# Patient Record
Sex: Female | Born: 1985
Health system: Southern US, Community
[De-identification: ages and names within clinical notes are randomized; demographics above are authoritative.]

## PROBLEM LIST (undated history)

## (undated) DIAGNOSIS — R51 Headache: Secondary | ICD-10-CM

## (undated) DIAGNOSIS — G43909 Migraine, unspecified, not intractable, without status migrainosus: Secondary | ICD-10-CM

## (undated) DIAGNOSIS — S060X9A Concussion with loss of consciousness of unspecified duration, initial encounter: Secondary | ICD-10-CM

## (undated) DIAGNOSIS — E039 Hypothyroidism, unspecified: Secondary | ICD-10-CM

## (undated) DIAGNOSIS — J45909 Unspecified asthma, uncomplicated: Secondary | ICD-10-CM

## (undated) DIAGNOSIS — R42 Dizziness and giddiness: Secondary | ICD-10-CM

## (undated) DIAGNOSIS — K219 Gastro-esophageal reflux disease without esophagitis: Secondary | ICD-10-CM

## (undated) DIAGNOSIS — E079 Disorder of thyroid, unspecified: Secondary | ICD-10-CM

## (undated) DIAGNOSIS — R112 Nausea with vomiting, unspecified: Secondary | ICD-10-CM

## (undated) DIAGNOSIS — Z8719 Personal history of other diseases of the digestive system: Secondary | ICD-10-CM

## (undated) DIAGNOSIS — Z9889 Other specified postprocedural states: Secondary | ICD-10-CM

## (undated) HISTORY — PX: CHOLECYSTECTOMY: SHX55

## (undated) HISTORY — PX: BREAST BIOPSY: SHX20

---

## 2004-04-08 ENCOUNTER — Observation Stay (HOSPITAL_COMMUNITY): Admission: RE | Admit: 2004-04-08 | Discharge: 2004-04-09 | Payer: Self-pay | Admitting: General Surgery

## 2012-08-13 ENCOUNTER — Telehealth: Payer: Self-pay | Admitting: *Deleted

## 2012-08-13 NOTE — Telephone Encounter (Signed)
Left message for pt to return my call so I can schedule genetic appt. 

## 2012-08-15 ENCOUNTER — Ambulatory Visit: Payer: Self-pay | Admitting: Genetic Counselor

## 2012-08-15 ENCOUNTER — Ambulatory Visit: Payer: Self-pay | Admitting: Lab

## 2012-08-15 DIAGNOSIS — Z8481 Family history of carrier of genetic disease: Secondary | ICD-10-CM

## 2012-08-19 NOTE — Progress Notes (Signed)
Patient was seen for genetic counseling by Annia Friendly, MS, CGC.  Single site BRCA testing was ordered.

## 2012-09-23 ENCOUNTER — Emergency Department (HOSPITAL_BASED_OUTPATIENT_CLINIC_OR_DEPARTMENT_OTHER): Payer: BC Managed Care – PPO

## 2012-09-23 ENCOUNTER — Encounter (HOSPITAL_BASED_OUTPATIENT_CLINIC_OR_DEPARTMENT_OTHER): Payer: Self-pay | Admitting: *Deleted

## 2012-09-23 ENCOUNTER — Emergency Department (HOSPITAL_BASED_OUTPATIENT_CLINIC_OR_DEPARTMENT_OTHER)
Admission: EM | Admit: 2012-09-23 | Discharge: 2012-09-24 | Disposition: A | Discharge status: Home / Self Care | Payer: BC Managed Care – PPO | Attending: Emergency Medicine | Admitting: Emergency Medicine

## 2012-09-23 DIAGNOSIS — R062 Wheezing: Secondary | ICD-10-CM | POA: Insufficient documentation

## 2012-09-23 DIAGNOSIS — Z888 Allergy status to other drugs, medicaments and biological substances status: Secondary | ICD-10-CM | POA: Insufficient documentation

## 2012-09-23 DIAGNOSIS — E079 Disorder of thyroid, unspecified: Secondary | ICD-10-CM | POA: Insufficient documentation

## 2012-09-23 DIAGNOSIS — Z79899 Other long term (current) drug therapy: Secondary | ICD-10-CM | POA: Insufficient documentation

## 2012-09-23 DIAGNOSIS — J45901 Unspecified asthma with (acute) exacerbation: Secondary | ICD-10-CM | POA: Insufficient documentation

## 2012-09-23 DIAGNOSIS — T7840XA Allergy, unspecified, initial encounter: Secondary | ICD-10-CM

## 2012-09-23 DIAGNOSIS — Z8679 Personal history of other diseases of the circulatory system: Secondary | ICD-10-CM | POA: Insufficient documentation

## 2012-09-23 DIAGNOSIS — T4995XA Adverse effect of unspecified topical agent, initial encounter: Secondary | ICD-10-CM | POA: Insufficient documentation

## 2012-09-23 HISTORY — DX: Disorder of thyroid, unspecified: E07.9

## 2012-09-23 HISTORY — DX: Unspecified asthma, uncomplicated: J45.909

## 2012-09-23 HISTORY — DX: Migraine, unspecified, not intractable, without status migrainosus: G43.909

## 2012-09-23 LAB — PREGNANCY, URINE: Preg Test, Ur: NEGATIVE

## 2012-09-23 MED ORDER — ALBUTEROL SULFATE (5 MG/ML) 0.5% IN NEBU
5.0000 mg | INHALATION_SOLUTION | Freq: Once | RESPIRATORY_TRACT | Status: AC
  Administered 2012-09-23: 5 mg via RESPIRATORY_TRACT

## 2012-09-23 MED ORDER — IPRATROPIUM BROMIDE 0.02 % IN SOLN
RESPIRATORY_TRACT | Status: AC
  Filled 2012-09-23: qty 2.5

## 2012-09-23 MED ORDER — IPRATROPIUM BROMIDE 0.02 % IN SOLN
0.5000 mg | Freq: Once | RESPIRATORY_TRACT | Status: AC
  Administered 2012-09-23: 0.5 mg via RESPIRATORY_TRACT

## 2012-09-23 MED ORDER — ALBUTEROL SULFATE (5 MG/ML) 0.5% IN NEBU
INHALATION_SOLUTION | RESPIRATORY_TRACT | Status: AC
  Filled 2012-09-23: qty 1

## 2012-09-23 NOTE — ED Notes (Signed)
MD at bedside. 

## 2012-09-23 NOTE — ED Notes (Signed)
Pt c/o SOB x 1 day hx asthma

## 2012-09-23 NOTE — ED Provider Notes (Addendum)
History  This chart was scribed for Nicole Rabine Smitty Cords, MD by Ardeen Jourdain, ED Scribe. This patient was seen in room MH06/MH06 and the patient's care was started at 2300.  CSN: [id number]  Arrival date & time 09/23/12  2252   First MD Initiated Contact with Patient 09/23/12 2300      Chief Complaint  Patient presents with  . Shortness of Breath     Patient is a 26 y.o. female presenting with wheezing. The history is provided by the patient. No language interpreter was used.  Wheezing  The current episode started today. The onset was gradual. The problem occurs continuously. The problem has been gradually worsening. The problem is moderate. The symptoms are relieved by beta-agonist inhalers (partially). Exacerbated by: taking mucinex. Associated symptoms include wheezing. Pertinent negatives include no chest pain, no chest pressure, no fever, no rhinorrhea, no sore throat, no stridor and no cough. There was no intake of a foreign body. She has not inhaled smoke recently. Her past medical history is significant for asthma and past wheezing. Urine output has been normal.    Nicole Reed is a 26 y.o. female who presents to the Emergency Department complaining of gradually worsening wheezing. She denies fever, neck pain, sore throat, visual disturbance, CP, abdominal pain, nausea, emesis, diarrhea, urinary symptoms, back pain, HA, weakness, numbness and rash as associated symptoms.  She took mucinex and then felt like throat was closing.     Past Medical History  Diagnosis Date  . Asthma   . Thyroid disease   . Migraine     Past Surgical History  Procedure Date  . Cholecystectomy     History reviewed. No pertinent family history.  History  Substance Use Topics  . Smoking status: Never Smoker   . Smokeless tobacco: Not on file  . Alcohol Use: No   No OB history available.   Review of Systems  Constitutional: Negative for fever.  HENT: Negative for sore  throat, rhinorrhea, neck pain and neck stiffness.   Respiratory: Positive for wheezing. Negative for cough and stridor.   Cardiovascular: Negative for chest pain.  All other systems reviewed and are negative.    A complete 10 system review of systems was obtained and all systems are negative except as noted in the HPI and PMH.    Allergies  Review of patient's allergies indicates no known allergies.  Home Medications   Current Outpatient Rx  Name  Route  Sig  Dispense  Refill  . ALBUTEROL SULFATE 1.25 MG/3ML IN NEBU   Nebulization   Take 1 ampule by nebulization every 6 (six) hours as needed.         . ATENOLOL 25 MG PO TABS   Oral   Take 25 mg by mouth daily.           Triage Vitals; BP 151/105  Pulse 94  Temp 98.7 F (37.1 C) (Oral)  Resp 20  Ht 5\' 5"  (1.651 m)  Wt 240 lb (108.863 kg)  BMI 39.94 kg/m2  SpO2 98%  LMP 09/02/2012  Physical Exam  Nursing note and vitals reviewed. Constitutional: She is oriented to person, place, and time. She appears well-developed and well-nourished. No distress.  HENT:  Head: Normocephalic and atraumatic.  Mouth/Throat: Oropharynx is clear and moist. No oropharyngeal exudate.       Widely patent, no swelling of lips, tongue or throat, mallem pati score of 1  Eyes: EOM are normal. Pupils are equal, round, and reactive to light.  airway widely patent, mallempati class 1  Neck: Normal range of motion. Neck supple. No tracheal deviation present.       No lymphadenoathy  Cardiovascular: Normal rate, regular rhythm and normal heart sounds.   Pulmonary/Chest: Effort normal and breath sounds normal. No stridor. No respiratory distress. She has no wheezes. She has no rales. She exhibits no tenderness.       No Stridor  Abdominal: Soft. Bowel sounds are normal. There is no tenderness.  Musculoskeletal: Normal range of motion. She exhibits no edema.  Lymphadenopathy:    She has no cervical adenopathy.  Neurological: She is alert  and oriented to person, place, and time.  Skin: Skin is warm and dry.  Psychiatric: She has a normal mood and affect. Her behavior is normal.    ED Course  Procedures (including critical care time)  DIAGNOSTIC STUDIES: Oxygen Saturation is 98% on room air, normal by my interpretation.    COORDINATION OF CARE:  11:07: Medication Orders: albuterol (PROVENTIL) (5 MG/ML) 0.5% nebulizer solution, ipratropium (ATROVENT) 0.02 % nebulizer solution   11:15: Medication Orders: ipratropium (ATROVENT) nebulizer solution 0.5 mg Once, albuterol (PROVENTIL) (5 MG/ML) 0.5% nebulizer solution 5 mg Once   11:11 PM: Discussed treatment plan which includes a breathing treatment with pt at bedside and pt agreed to plan.  Labs Reviewed - No data to display No results found.   No diagnosis found.    MDM  Allergic reaction to mucinex.  Airway narrowing seen on film resolved by CT post steroids.  Will d/c with steroids, use benadryl every six as needed.  List mucinex as allergy.  Return for concerning symptoms, follow up with your family doctor.  Patient and husband verbalize understanding and agree to follow up      I personally performed the services described in this documentation, which was scribed in my presence. The recorded information has been reviewed and is accurate.     Jasmine Awe, MD 09/24/12 0208  Jasmine Awe, MD 09/24/12 [phone]

## 2012-09-24 ENCOUNTER — Encounter (HOSPITAL_BASED_OUTPATIENT_CLINIC_OR_DEPARTMENT_OTHER): Payer: Self-pay | Admitting: Emergency Medicine

## 2012-09-24 ENCOUNTER — Emergency Department (HOSPITAL_BASED_OUTPATIENT_CLINIC_OR_DEPARTMENT_OTHER): Payer: BC Managed Care – PPO

## 2012-09-24 LAB — BASIC METABOLIC PANEL
BUN: 13 mg/dL (ref 6–23)
CO2: 24 mEq/L (ref 19–32)
Calcium: 9.1 mg/dL (ref 8.4–10.5)
Chloride: 103 mEq/L (ref 96–112)
Creatinine, Ser: 0.9 mg/dL (ref 0.50–1.10)
GFR calc Af Amer: >90 mL/min (ref >90)
GFR calc non Af Amer: 86 mL/min — ABNORMAL LOW (ref >90)
Glucose, Bld: 139 mg/dL — ABNORMAL HIGH (ref 70–99)
Potassium: 3.3 mEq/L — ABNORMAL LOW (ref 3.5–5.1)
Sodium: 141 mEq/L (ref 135–145)

## 2012-09-24 LAB — CBC WITH DIFFERENTIAL/PLATELET
Basophils Absolute: 0 10*3/uL (ref 0.0–0.1)
Basophils Relative: 0 % (ref 0–1)
Eosinophils Absolute: 0.2 10*3/uL (ref 0.0–0.7)
Eosinophils Relative: 1 % (ref 0–5)
HCT: 37.7 % (ref 36.0–46.0)
Hemoglobin: 12.5 g/dL (ref 12.0–15.0)
Lymphocytes Relative: 18 % (ref 12–46)
Lymphs Abs: 2.7 10*3/uL (ref 0.7–4.0)
MCH: 27.1 pg (ref 26.0–34.0)
MCHC: 33.2 g/dL (ref 30.0–36.0)
MCV: 81.8 fL (ref 78.0–100.0)
Monocytes Absolute: 1 10*3/uL (ref 0.1–1.0)
Monocytes Relative: 6 % (ref 3–12)
Neutro Abs: 11.3 10*3/uL — ABNORMAL HIGH (ref 1.7–7.7)
Neutrophils Relative %: 74 % (ref 43–77)
Platelets: 312 10*3/uL (ref 150–400)
RBC: 4.61 MIL/uL (ref 3.87–5.11)
RDW: 13.6 % (ref 11.5–15.5)
WBC: 15.2 10*3/uL — ABNORMAL HIGH (ref 4.0–10.5)

## 2012-09-24 MED ORDER — IOHEXOL 300 MG/ML  SOLN
80.0000 mL | Freq: Once | INTRAMUSCULAR | Status: AC | PRN
  Administered 2012-09-24: 80 mL via INTRAVENOUS

## 2012-09-24 MED ORDER — PREDNISONE 50 MG PO TABS: 50.0000 mg | ORAL_TABLET | Freq: Every day | ORAL | Status: DC

## 2012-09-24 MED ORDER — DIPHENHYDRAMINE HCL 50 MG/ML IJ SOLN
25.0000 mg | Freq: Once | INTRAMUSCULAR | Status: AC
  Administered 2012-09-24: 25 mg via INTRAVENOUS
  Filled 2012-09-24: qty 1

## 2012-09-24 MED ORDER — DEXAMETHASONE SODIUM PHOSPHATE 10 MG/ML IJ SOLN
10.0000 mg | Freq: Once | INTRAMUSCULAR | Status: AC
  Administered 2012-09-24: 10 mg via INTRAVENOUS
  Filled 2012-09-24: qty 1

## 2016-04-21 ENCOUNTER — Other Ambulatory Visit (HOSPITAL_COMMUNITY): Payer: Self-pay | Admitting: Surgery

## 2016-05-15 ENCOUNTER — Encounter: Payer: BLUE CROSS/BLUE SHIELD | Attending: Surgery | Admitting: Dietician

## 2016-05-15 ENCOUNTER — Encounter: Payer: Self-pay | Admitting: Dietician

## 2016-05-15 DIAGNOSIS — Z713 Dietary counseling and surveillance: Secondary | ICD-10-CM | POA: Insufficient documentation

## 2016-05-15 NOTE — Progress Notes (Signed)
  Pre-Op Assessment Visit:  Pre-Operative Sleeve gastrectomy Surgery  Medical Nutrition Therapy:  Appt start time: 0845   End time:  0920.  Patient was seen on 05/15/2016 for Pre-Operative Nutrition Assessment. [facility] on 05/15/2016.   Preferred Learning Style:   No preference indicated   Learning Readiness:   Ready  Handouts given during visit include:  Pre-Op Goals Bariatric Surgery Protein Shakes Pre op diet   During the appointment today the following Pre-Op Goals were reviewed with the patient: Maintain or lose weight as instructed by your surgeon Make healthy food choices Begin to limit portion sizes Limited concentrated sugars and fried foods Keep fat/sugar in the single digits per serving on   food labels Practice CHEWING your food  (aim for 30 chews per bite or until applesauce consistency) Practice not drinking 15 minutes before, during, and 30 minutes after each meal/snack Avoid all carbonated beverages  Avoid/limit caffeinated beverages  Avoid all sugar-sweetened beverages Consume 3 meals per day; eat every 3-5 hours Make a list of non-food related activities Aim for 64-100 ounces of FLUID daily  Aim for at least 60-80 grams of PROTEIN daily Look for a liquid protein source that contain ?15 g protein and ?5 g carbohydrate  (ex: shakes, drinks, shots)  Demonstrated degree of understanding via:  Teach Back  Teaching Method Utilized:  Visual Auditory Hands on  Barriers to learning/adherence to lifestyle change: none  [facility] to call the [facility] to enroll in Pre-Op and Post-Op Nutrition Education when surgery date is scheduled.

## 2016-05-17 ENCOUNTER — Encounter: Payer: Self-pay | Admitting: Dietician

## 2016-05-17 ENCOUNTER — Encounter: Payer: BLUE CROSS/BLUE SHIELD | Admitting: Dietician

## 2016-05-17 NOTE — Patient Instructions (Addendum)
-  Continue to work on weaning off soda -Keep avoiding fast food -Start thinking about reducing carbs and increasing protein  -Beans, eggs, chicken, cottage cheese, Greek yogurt, 2% cheese, Premier protein shakes -Try an approved protein shake 

## 2016-05-17 NOTE — Progress Notes (Signed)
Supervised Weight Loss:  Appt start time: 400 end time:  415  SWL visit 1:  Primary concerns today: Victorino DikeJennifer returns for her 1st SWL appointment in preparation for sleeve gastrectomy having maintained her weight. She has been discussing the pre op goals and diet with her husband. They plan to go grocery shopping tonight. Her husband is a Investment banker, operationalchef and is very supportive and willing to make changes with her. She has switched to diet soda and plans to continue weaning off sodas completely. Victorino DikeJennifer plans to address 1 goal each month.   Weight: 366 lbs BMI: 59.2  MEDICATIONS: see list  DIETARY INTAKE:  24-hr recall:  B ( AM): banana  Snk ( AM) :  L ( PM): [place] and reduced fat chips  Snk ( PM):  D ( PM): salad with chicken   Snk ( PM):   Beverages: Diet Coke, water  Recent physical activity: did not assess   Estimated energy needs: 1500-1700 calories  Progress Towards Goal(s):  In progress.   Nutritional Diagnosis:  [place]-3.3 Overweight/obesity related to past poor dietary habits and physical inactivity as evidenced by patient in SWL for pending bariatric surgery following dietary guidelines for continued weight loss.     Intervention:  Nutrition counseling provided. Goals: -Continue to work on weaning off soda -Keep avoiding fast food -Start thinking about reducing carbs and increasing protein  -Beans, eggs, chicken, cottage cheese, [place] yogurt, 2% cheese, Premier protein shakes -Try an approved protein shake  Handouts given during visit include:  none  Monitoring/Evaluation:  Dietary intake, exercise, and body weight in 4 week(s).

## 2016-05-25 ENCOUNTER — Ambulatory Visit (HOSPITAL_COMMUNITY)
Admission: RE | Admit: 2016-05-25 | Discharge: 2016-05-25 | Disposition: A | Discharge status: Home / Self Care | Payer: BLUE CROSS/BLUE SHIELD | Source: Ambulatory Visit | Attending: Surgery | Admitting: Surgery

## 2016-05-25 DIAGNOSIS — Z6841 Body Mass Index (BMI) 40.0 and over, adult: Secondary | ICD-10-CM | POA: Insufficient documentation

## 2016-05-25 DIAGNOSIS — K449 Diaphragmatic hernia without obstruction or gangrene: Secondary | ICD-10-CM | POA: Diagnosis not present

## 2016-06-07 ENCOUNTER — Encounter: Payer: BLUE CROSS/BLUE SHIELD | Attending: Surgery | Admitting: Dietician

## 2016-06-07 ENCOUNTER — Encounter: Payer: Self-pay | Admitting: Dietician

## 2016-06-07 DIAGNOSIS — Z713 Dietary counseling and surveillance: Secondary | ICD-10-CM | POA: Diagnosis not present

## 2016-06-07 NOTE — Progress Notes (Signed)
Supervised Weight Loss:  Appt start time: 800 end time:  815  SWL visit 2:  Primary concerns today: Nicole Reed returns for her 2nd SWL appointment in preparation for sleeve gastrectomy having lost 16 pounds. She has started drinking a Premier protein shake for breakfast. Has completely cut out soda, fast food, and bread. She and her husband have been experimenting with a variety of bariatric friendly dinner recipes. Increasing intake of fresh vegetables and has started walking daily. Her husband is very supportive.  Goal: 326 lbs before surgery   Weight: 350 lbs BMI: 56.5  MEDICATIONS: see list  DIETARY INTAKE:  24-hr recall:  B ( AM): Premier shake  Snk ( AM) :  L ( PM): Lean Cuisine  Snk ( PM):  D ( PM): salad with chicken   Snk ( PM):   Beverages: water  Recent physical activity: walking 1 mile daily  Estimated energy needs: 1500-1700 calories  Progress Towards Goal(s):  In progress.   Nutritional Diagnosis:  [place]-3.3 Overweight/obesity related to past poor dietary habits and physical inactivity as evidenced by patient in SWL for pending bariatric surgery following dietary guidelines for continued weight loss.     Intervention:  Nutrition counseling provided. Goals: -Continue to work on weaning off soda -Keep avoiding fast food -Start thinking about reducing carbs and increasing protein  -Beans, eggs, chicken, cottage cheese, [place] yogurt, 2% cheese, Premier protein shakes -Try an approved protein shake  Handouts given during visit include:  none  Monitoring/Evaluation:  Dietary intake, exercise, and body weight in 4 week(s).

## 2016-06-07 NOTE — Patient Instructions (Signed)
-  Continue to work on weaning off soda -Keep avoiding fast food -Start thinking about reducing carbs and increasing protein  -Beans, eggs, chicken, cottage cheese, Greek yogurt, 2% cheese, Premier protein shakes -Try an approved protein shake 

## 2016-06-28 ENCOUNTER — Encounter: Payer: BLUE CROSS/BLUE SHIELD | Admitting: Dietician

## 2016-06-28 ENCOUNTER — Encounter: Payer: Self-pay | Admitting: Dietician

## 2016-06-28 NOTE — Patient Instructions (Signed)
-  Continue to work on weaning off soda -Keep avoiding fast food -Start thinking about reducing carbs and increasing protein  -Beans, eggs, chicken, cottage cheese, [place] yogurt, 2% cheese, Premier protein shakes -Try an approved protein shake

## 2016-06-28 NOTE — Progress Notes (Signed)
Supervised Weight Loss:  Appt start time: 800 end time:  815  SWL visit 3:  Primary concerns today: Nicole DikeJennifer returns for her 3rd SWL appointment in preparation for sleeve gastrectomy having lost another 10.5 lbs. She reports that she has felt like she has lost and regained the same 4 pounds in the last few weeks. She has always had difficulty maintaining her weight and finds this frustrating. Feet are still swelling significantly throughout the day. Still exercising (walking, stairs, and upper body strength training) 3x a week. Continues to experiment with bariatric friendly recipes. Has not been taking Atenolol and plans to discuss this with her doctor.   Goal: Between 300-326 lbs before surgery   Weight: 339.5 lbs BMI: 54.8  MEDICATIONS: see list  DIETARY INTAKE:  24-hr recall:  B ( AM): Premier shake  Snk ( AM) :  L ( PM): Lean Cuisine  Snk ( PM):  D ( PM): salad with chicken   Snk ( PM):   Beverages: water  Recent physical activity: walking 1 mile daily  Estimated energy needs: 1500-1700 calories  Progress Towards Goal(s):  In progress.   Nutritional Diagnosis:  [place]-3.3 Overweight/obesity related to past poor dietary habits and physical inactivity as evidenced by patient in SWL for pending bariatric surgery following dietary guidelines for continued weight loss.     Intervention:  Nutrition counseling provided. Goals: -Continue to work on weaning off soda -Keep avoiding fast food -Start thinking about reducing carbs and increasing protein  -Beans, eggs, chicken, cottage cheese, [place] yogurt, 2% cheese, Premier protein shakes -Try an approved protein shake  Handouts given during visit include:  none  Monitoring/Evaluation:  Dietary intake, exercise, and body weight in 4 week(s).

## 2016-07-19 ENCOUNTER — Encounter: Payer: BLUE CROSS/BLUE SHIELD | Attending: Surgery | Admitting: Dietician

## 2016-07-19 DIAGNOSIS — Z713 Dietary counseling and surveillance: Secondary | ICD-10-CM | POA: Insufficient documentation

## 2016-07-19 NOTE — Progress Notes (Signed)
Supervised Weight Loss:  Appt start time: 225 end time:  240  SWL visit 4:  Primary concerns today: Victorino DikeJennifer returns for her 4th SWL appointment in preparation for sleeve gastrectomy having lost another 7 lbs. She has noticed that her weight loss has slowed down and that it has become harder to stay motivated. She has begun replacing lunch with a protein shake and a cheese stick and sugar free jello. She reports she does not feel hungry during the work day, mainly because she stays busy.   Goal: Between 300-326 lbs before surgery   Weight: 332.4 lbs BMI: 53.7  MEDICATIONS: see list  DIETARY INTAKE:  24-hr recall:  B ( AM): Premier shake  Snk ( AM) :  L ( PM): Premier protein shake, cheese stick, and sugar free jello  Snk ( PM):  D ( PM): salad with chicken   Snk ( PM):   Beverages: water  Recent physical activity: walking 1 mile daily  Estimated energy needs: 1500-1700 calories  Progress Towards Goal(s):  In progress.   Nutritional Diagnosis:  [place]-3.3 Overweight/obesity related to past poor dietary habits and physical inactivity as evidenced by patient in SWL for pending bariatric surgery following dietary guidelines for continued weight loss.     Intervention:  Nutrition counseling provided. Goals: -Continue to work on weaning off soda -Keep avoiding fast food -Start thinking about reducing carbs and increasing protein  -Beans, eggs, chicken, cottage cheese, [place] yogurt, 2% cheese, Premier protein shakes -Try an approved protein shake  Handouts given during visit include:  none  Monitoring/Evaluation:  Dietary intake, exercise, and body weight in 4 week(s).

## 2016-07-19 NOTE — Patient Instructions (Signed)
-  Continue to work on weaning off soda -Keep avoiding fast food -Start thinking about reducing carbs and increasing protein  -Beans, eggs, chicken, cottage cheese, Greek yogurt, 2% cheese, Premier protein shakes -Try an approved protein shake 

## 2016-08-09 ENCOUNTER — Encounter: Payer: BLUE CROSS/BLUE SHIELD | Attending: Surgery | Admitting: Dietician

## 2016-08-09 ENCOUNTER — Encounter: Payer: Self-pay | Admitting: Dietician

## 2016-08-09 DIAGNOSIS — Z713 Dietary counseling and surveillance: Secondary | ICD-10-CM | POA: Diagnosis not present

## 2016-08-09 NOTE — Progress Notes (Signed)
Supervised Weight Loss:  Appt start time: 805 end time:  820  SWL visit 5:  Primary concerns today: Nicole DikeJennifer returns for her 5th SWL appointment in preparation for sleeve gastrectomy having lost another 10 lbs. She states that it has been a "rough" 3 weeks. Notices that when she is out of her comfort zone and in a situation where she is exposed to trigger foods it is more difficult to stay on track. Has continued to experiment with new recipes. She reports that surgery is scheduled for November 27th. Struggling with constipation.  Goal: Between 300-326 lbs before surgery   Weight: 322.7 lbs BMI: 52  MEDICATIONS: see list  DIETARY INTAKE:  24-hr recall:  B ( AM): Premier shake  Snk ( AM) :  L ( PM): Premier protein shake, cheese stick, and sugar free jello  Snk ( PM):  D ( PM): salad with chicken   Snk ( PM):   Beverages: water  Recent physical activity: walking 1 mile daily  Estimated energy needs: 1500-1700 calories  Progress Towards Goal(s):  In progress.   Nutritional Diagnosis:  -3.3 Overweight/obesity related to past poor dietary habits and physical inactivity as evidenced by patient in SWL for pending bariatric surgery following dietary guidelines for continued weight loss.     Intervention:  Nutrition counseling provided. Goals: -Try bringing back a solid meal for lunch  -Lean Cuisine or Healthy Choice (30 grams of carbs or less)  -Leftovers? -Add some easier/simpler meals in your rotation  -Continue to find fun active things to do on the weekends  Handouts given during visit include:  none  Monitoring/Evaluation:  Dietary intake, exercise, and body weight in 4 week(s).

## 2016-08-09 NOTE — Patient Instructions (Addendum)
-  Try bringing back a solid meal for lunch  -Lean Cuisine or Healthy Choice (30 grams of carbs or less)  -Leftovers?  -Add some easier/simpler meals in your rotation   -Continue to find fun active things to do on the weekends

## 2016-08-30 ENCOUNTER — Encounter: Payer: BLUE CROSS/BLUE SHIELD | Admitting: Dietician

## 2016-08-30 ENCOUNTER — Encounter: Payer: Self-pay | Admitting: Dietician

## 2016-08-30 NOTE — Patient Instructions (Signed)
-  Try bringing back a solid meal for lunch  -Lean Cuisine or Healthy Choice (30 grams of carbs or less)  -Leftovers?  -Add some easier/simpler meals in your rotation   -Continue to find fun active things to do on the weekends 

## 2016-08-30 NOTE — Progress Notes (Signed)
Supervised Weight Loss:  Appt start time: 805 end time:  820  SWL visit 6:  Primary concerns today: Nicole Reed returns for her 6th SWL appointment in preparation for sleeve gastrectomy having lost another 6 lbs. She states that she feels like her new lifestyle has become normal for her. Has continued to stay active. Got a quality pair of running shoes and is training for a 5k.    Goal: Between 300-326 lbs before surgery   Weight: 317.5 lbs BMI: 51.25  MEDICATIONS: see list  DIETARY INTAKE:  24-hr recall:  B ( AM): Premier shake  Snk ( AM) :  L ( PM): Premier protein shake, cheese stick, and sugar free jello  Snk ( PM):  D ( PM): salad with chicken   Snk ( PM):   Beverages: water  Recent physical activity: walking 1 mile daily  Estimated energy needs: 1500-1700 calories  Progress Towards Goal(s):  In progress.   Nutritional Diagnosis:  Nicole Reed-3.3 Overweight/obesity related to past poor dietary habits and physical inactivity as evidenced by patient in SWL for pending bariatric surgery following dietary guidelines for continued weight loss.     Intervention:  Nutrition counseling provided. Goals: -Try bringing back a solid meal for lunch  -Lean Cuisine or Healthy Choice (30 grams of carbs or less)  -Leftovers? -Add some easier/simpler meals in your rotation  -Continue to find fun active things to do on the weekends  Handouts given during visit include:  none  Monitoring/Evaluation:  Dietary intake, exercise, and body weight in 4 week(s).

## 2016-09-11 ENCOUNTER — Encounter: Payer: BLUE CROSS/BLUE SHIELD | Attending: Surgery | Admitting: Dietician

## 2016-09-11 ENCOUNTER — Encounter: Payer: Self-pay | Admitting: Dietician

## 2016-09-11 DIAGNOSIS — Z713 Dietary counseling and surveillance: Secondary | ICD-10-CM | POA: Insufficient documentation

## 2016-09-11 NOTE — Progress Notes (Signed)
  Pre-Operative Nutrition Class:  Appt start time: 830   End time:  930.  Patient was seen on 09/11/2016 for Pre-Operative Bariatric Surgery Education at [facility].   Surgery date: 10/02/2016 Surgery type: sleeve gastrectomy Start weight at [facility]: 366 lbs  Weight today: 317.8 lbs  TANITA  BODY COMP RESULTS  09/11/16   BMI (kg/m^2) 51.3   Fat Mass (lbs) 172.6   Fat Free Mass (lbs) 145.2   Total Body Water (lbs) 109.4   Samples given per MNT protocol. Patient educated on appropriate usage: Premier protein shake (chocolate - qty 1) Lot #: [id number] Exp: 07/2017  The following the learning objectives were met by the patient during this course:  Identify Pre-Op Dietary Goals and will begin 2 weeks pre-operatively  Identify appropriate sources of fluids and proteins   State protein recommendations and appropriate sources pre and post-operatively  Identify Post-Operative Dietary Goals and will follow for 2 weeks post-operatively  Identify appropriate multivitamin and calcium sources  Describe the need for physical activity post-operatively and will follow MD recommendations  State when to call healthcare provider regarding medication questions or post-operative complications  Handouts given during class include:  Pre-Op Bariatric Surgery Diet Handout  Protein Shake Handout  Post-Op Bariatric Surgery Nutrition Handout  [facility] Program Information Flyer  Support Group Information Flyer  [facility] Bariatric Supplements Price List  Follow-Up Plan: Patient will follow-up at [facility] 2 weeks post operatively for diet advancement per MD.

## 2016-09-14 ENCOUNTER — Ambulatory Visit: Payer: Self-pay | Admitting: Surgery

## 2016-09-14 NOTE — H&P (Signed)
Nicole Reed [facility] Patient #: [id number] DOB: 1986/08/28 Married / Language: English / Race: White Female   History of Present Illness  The patient is a 30 year old female who presented for a bariatric surgery evaluation. Nicole Reed has been to our online seminar and is interested in a sleeve gastrectomy because she has 2 friends that have had the surgery. Her [facility] and her Obgyn Judye BosRick Tavoon has placed her on Phentermine to help her lose weight. She took this but it started interfering with sleep. She and her husband have been trying to get pregnant and she feels like it is her BMI of 58. She has tried many diets including HCG shots which caused her to lose 30 lbs. She does have GER if she doesn't take omeprazole. UGI showed a small hiatal lhernia  We discussed bypass and sleeve and after our discussion she was pretty certain that she wanted a sleeve gastrectomy and this was discussed with her in detail. She is scheduled for sleeve gastrectomy on Nov 27.     Other Problems  Asthma Migraine Headache Thyroid Disease  Past Surgical History  Gallbladder Surgery - Laparoscopic  Diagnostic Studies History  Colonoscopy never Mammogram never Pap Smear 1-5 years ago  Allergies  Codeine Sulfate *ANALGESICS - OPIOID*  Medication History  Levothyroxine Sodium (200MCG Tablet, Oral) Active. Atenolol (50MG  Tablet, Oral) Active. Omeprazole (20MG  Capsule DR, Oral) Active. Medications Reconciled  Social History Alcohol use Moderate alcohol use. Caffeine use Carbonated beverages. No drug use Tobacco use Never smoker.  Family History  Breast Cancer Mother, Sister. Diabetes Mellitus Father, Mother. Hypertension Father, Mother. Migraine Headache Sister. Thyroid problems Sister.  Pregnancy / Birth History  Age at menarche 10 years. Contraceptive History Oral contraceptives. Gravida  0 Para 0 Regular periods    Review of Systems  General Not Present- Appetite Loss, Chills, Fatigue, Fever, Night Sweats, Weight Gain and Weight Loss. Skin Not Present- Change in Wart/Mole, Dryness, Hives, Jaundice, New Lesions, Non-Healing Wounds, Rash and Ulcer. HEENT Not Present- Earache, Hearing Loss, Hoarseness, Nose Bleed, Oral Ulcers, Ringing in the Ears, Seasonal Allergies, Sinus Pain, Sore Throat, Visual Disturbances, Wears glasses/contact lenses and Yellow Eyes. Respiratory Not Present- Bloody sputum, Chronic Cough, Difficulty Breathing, Snoring and Wheezing. Breast Not Present- Breast Mass, Breast Pain, Nipple Discharge and Skin Changes. Cardiovascular Present- Swelling of Extremities. Not Present- Chest Pain, Difficulty Breathing Lying Down, Leg Cramps, Palpitations, Rapid Heart Rate and Shortness of Breath. Gastrointestinal Not Present- Abdominal Pain, Bloating, Bloody Stool, Change in Bowel Habits, Chronic diarrhea, Constipation, Difficulty Swallowing, Excessive gas, Gets full quickly at meals, Hemorrhoids, Indigestion, Nausea, Rectal Pain and Vomiting. Female Genitourinary Not Present- Frequency, Nocturia, Painful Urination, Pelvic Pain and Urgency. Musculoskeletal Not Present- Back Pain, Joint Pain, Joint Stiffness, Muscle Pain, Muscle Weakness and Swelling of Extremities. Neurological Not Present- Decreased Memory, Fainting, Headaches, Numbness, Seizures, Tingling, Tremor, Trouble walking and Weakness. Psychiatric Not Present- Anxiety, Bipolar, Change in Sleep Pattern, Depression, Fearful and Frequent crying. Endocrine Not Present- Cold Intolerance, Excessive Hunger, Hair Changes, Heat Intolerance, Hot flashes and New Diabetes. Hematology Not Present- Easy Bruising, Excessive bleeding, Gland problems, HIV and Persistent Infections.  Vitals  Weight: 319 Height: 66.5in Body Surface Area: 2.6 m Body Mass Index: 50.72 Temp.: 98.25F(Oral)  Pulse: 60 (Regular)  BP:  118/70 (Sitting, Left Arm, Standard)       Physical Exam  General  Has lost ~45 lbs since consultation Note: Obese WFNAD HEENT unremarkable  Neck supple Chest clear Heart SR Abdomen-prior lap chole Ext-symmetrical swelling in her feet     Assessment & Plan (Reagann Dolce B. Ilayda Toda MD; 03/24/2016 12:03 PM) MORBID OBESITY WITH BMI OF 50.0-59.9, ADULT (E66.01)  GERD on omeprazole  Lap sleeve gastrectomy with hiatal hernia repair  Matt B. Brexlee Heberlein, MD, FACS  

## 2016-09-26 NOTE — Patient Instructions (Signed)
Heide SparkJennifer Fluckiger  09/26/2016   Your procedure is scheduled on: Monday 10/02/2016  [facility]  [place] to 3rd floor to  Short Stay Center at   0805  AM.  Call this number if you have problems the morning of surgery [phone]   Remember: ONLY 1 PERSON MAY GO WITH YOU TO SHORT STAY TO GET  READY MORNING OF YOUR SURGERY.   Do not eat food or drink liquids :After Midnight.     Take these medicines the morning of surgery with A SIP OF WATER: Levothyroxine, Omeprazole (Prilosec), Use Albuterol inhaler if needed                                  You may not have any metal on your body including hair pins and              piercings  Do not wear jewelry, make-up, lotions, powders or perfumes, deodorant             Do not wear nail polish.  Do not shave  48 hours prior to surgery.              Men may shave face and neck.   Do not bring valuables to the hospital. [facility] IS NOT             RESPONSIBLE   FOR VALUABLES.  Contacts, dentures or bridgework may not be worn into surgery.  Leave suitcase in the car. After surgery it may be brought to your room.                 Please read over the following fact sheets you were given: _____________________________________________________________________             [facility] - Preparing for Surgery Before surgery, you can play an important role.  Because skin is not sterile, your skin needs to be as free of germs as possible.  You can reduce the number of germs on your skin by washing with CHG (chlorahexidine gluconate) soap before surgery.  CHG is an antiseptic cleaner which kills germs and bonds with the skin to continue killing germs even after washing. Please DO NOT use if you have an allergy to CHG or antibacterial soaps.  If your skin becomes reddened/irritated stop using the CHG and inform your nurse when you arrive at Short Stay. Do not shave (including legs and  underarms) for at least 48 hours prior to the first CHG shower.  You may shave your face/neck. Please follow these instructions carefully:  1.  Shower with CHG Soap the night before surgery and the  morning of Surgery.  2.  If you choose to wash your hair, wash your hair first as usual with your  normal  shampoo.  3.  After you shampoo, rinse your hair and body thoroughly to remove the  shampoo.                           4.  Use CHG as you would any other liquid soap.  You can apply chg directly  to the skin and wash                       Gently with  a scrungie or clean washcloth.  5.  Apply the CHG Soap to your body ONLY FROM THE NECK DOWN.   Do not use on face/ open                           Wound or open sores. Avoid contact with eyes, ears mouth and genitals (private parts).                       Wash face,  Genitals (private parts) with your normal soap.             6.  Wash thoroughly, paying special attention to the area where your surgery  will be performed.  7.  Thoroughly rinse your body with warm water from the neck down.  8.  DO NOT shower/wash with your normal soap after using and rinsing off  the CHG Soap.                9.  Pat yourself dry with a clean towel.            10.  Wear clean pajamas.            11.  Place clean sheets on your bed the night of your first shower and do not  sleep with pets. Day of Surgery : Do not apply any lotions/deodorants the morning of surgery.  Please wear clean clothes to the hospital/surgery center.  FAILURE TO FOLLOW THESE INSTRUCTIONS MAY RESULT IN THE CANCELLATION OF YOUR SURGERY PATIENT SIGNATURE_________________________________  NURSE SIGNATURE__________________________________  ________________________________________________________________________

## 2016-09-27 ENCOUNTER — Encounter (HOSPITAL_COMMUNITY)
Admission: RE | Admit: 2016-09-27 | Discharge: 2016-09-27 | Disposition: A | Discharge status: Home / Self Care | Payer: BLUE CROSS/BLUE SHIELD | Source: Ambulatory Visit | Attending: Surgery | Admitting: Surgery

## 2016-09-27 ENCOUNTER — Encounter (HOSPITAL_COMMUNITY): Payer: Self-pay

## 2016-09-27 ENCOUNTER — Encounter (INDEPENDENT_AMBULATORY_CARE_PROVIDER_SITE_OTHER): Payer: Self-pay

## 2016-09-27 DIAGNOSIS — Z01818 Encounter for other preprocedural examination: Secondary | ICD-10-CM | POA: Insufficient documentation

## 2016-09-27 HISTORY — DX: Hypothyroidism, unspecified: E03.9

## 2016-09-27 HISTORY — DX: Gastro-esophageal reflux disease without esophagitis: K21.9

## 2016-09-27 HISTORY — DX: Headache: R51

## 2016-09-27 LAB — COMPREHENSIVE METABOLIC PANEL
ALT: 35 U/L (ref 14–54)
AST: 32 U/L (ref 15–41)
Albumin: 4.2 g/dL (ref 3.5–5.0)
Alkaline Phosphatase: 92 U/L (ref 38–126)
Anion gap: 9 (ref 5–15)
BUN: 17 mg/dL (ref 6–20)
CO2: 27 mmol/L (ref 22–32)
Calcium: 9.4 mg/dL (ref 8.9–10.3)
Chloride: 106 mmol/L (ref 101–111)
Creatinine, Ser: 0.77 mg/dL (ref 0.44–1.00)
GFR calc Af Amer: >60 mL/min (ref >60)
GFR calc non Af Amer: >60 mL/min (ref >60)
Glucose, Bld: 106 mg/dL — ABNORMAL HIGH (ref 65–99)
Potassium: 4.3 mmol/L (ref 3.5–5.1)
Sodium: 142 mmol/L (ref 135–145)
Total Bilirubin: 0.4 mg/dL (ref 0.3–1.2)
Total Protein: 7.7 g/dL (ref 6.5–8.1)

## 2016-09-27 LAB — CBC WITH DIFFERENTIAL/PLATELET
Basophils Absolute: 0 10*3/uL (ref 0.0–0.1)
Basophils Relative: 0 %
Eosinophils Absolute: 0.3 10*3/uL (ref 0.0–0.7)
Eosinophils Relative: 3 %
HCT: 35.5 % — ABNORMAL LOW (ref 36.0–46.0)
Hemoglobin: 10.5 g/dL — ABNORMAL LOW (ref 12.0–15.0)
Lymphocytes Relative: 26 %
Lymphs Abs: 2.9 10*3/uL (ref 0.7–4.0)
MCH: 21.4 pg — ABNORMAL LOW (ref 26.0–34.0)
MCHC: 29.6 g/dL — ABNORMAL LOW (ref 30.0–36.0)
MCV: 72.4 fL — ABNORMAL LOW (ref 78.0–100.0)
Monocytes Absolute: 0.9 10*3/uL (ref 0.1–1.0)
Monocytes Relative: 8 %
Neutro Abs: 6.9 10*3/uL (ref 1.7–7.7)
Neutrophils Relative %: 63 %
Platelets: 463 10*3/uL — ABNORMAL HIGH (ref 150–400)
RBC: 4.9 MIL/uL (ref 3.87–5.11)
RDW: 16.5 % — ABNORMAL HIGH (ref 11.5–15.5)
WBC: 11 10*3/uL — ABNORMAL HIGH (ref 4.0–10.5)

## 2016-10-02 ENCOUNTER — Encounter (HOSPITAL_COMMUNITY)
Admission: RE | Disposition: A | Discharge status: Home / Self Care | Payer: Self-pay | Source: Ambulatory Visit | Attending: Surgery

## 2016-10-02 ENCOUNTER — Encounter (HOSPITAL_COMMUNITY): Payer: Self-pay | Admitting: Certified Registered Nurse Anesthetist

## 2016-10-02 ENCOUNTER — Inpatient Hospital Stay (HOSPITAL_COMMUNITY): Payer: BLUE CROSS/BLUE SHIELD | Admitting: Anesthesiology

## 2016-10-02 ENCOUNTER — Inpatient Hospital Stay (HOSPITAL_COMMUNITY)
Admission: RE | Admit: 2016-10-02 | Discharge: 2016-10-04 | DRG: 621 | Disposition: A | Discharge status: Home / Self Care | Payer: BLUE CROSS/BLUE SHIELD | Source: Ambulatory Visit | Attending: Surgery | Admitting: Surgery

## 2016-10-02 DIAGNOSIS — K449 Diaphragmatic hernia without obstruction or gangrene: Secondary | ICD-10-CM | POA: Diagnosis present

## 2016-10-02 DIAGNOSIS — Z888 Allergy status to other drugs, medicaments and biological substances status: Secondary | ICD-10-CM

## 2016-10-02 DIAGNOSIS — Z803 Family history of malignant neoplasm of breast: Secondary | ICD-10-CM | POA: Diagnosis not present

## 2016-10-02 DIAGNOSIS — E039 Hypothyroidism, unspecified: Secondary | ICD-10-CM | POA: Diagnosis present

## 2016-10-02 DIAGNOSIS — Z6841 Body Mass Index (BMI) 40.0 and over, adult: Secondary | ICD-10-CM | POA: Diagnosis not present

## 2016-10-02 DIAGNOSIS — J45909 Unspecified asthma, uncomplicated: Secondary | ICD-10-CM | POA: Diagnosis present

## 2016-10-02 DIAGNOSIS — Z833 Family history of diabetes mellitus: Secondary | ICD-10-CM

## 2016-10-02 DIAGNOSIS — Z885 Allergy status to narcotic agent status: Secondary | ICD-10-CM | POA: Diagnosis not present

## 2016-10-02 DIAGNOSIS — Z9884 Bariatric surgery status: Secondary | ICD-10-CM

## 2016-10-02 DIAGNOSIS — Z8249 Family history of ischemic heart disease and other diseases of the circulatory system: Secondary | ICD-10-CM

## 2016-10-02 DIAGNOSIS — K219 Gastro-esophageal reflux disease without esophagitis: Secondary | ICD-10-CM | POA: Diagnosis present

## 2016-10-02 HISTORY — PX: HIATAL HERNIA REPAIR: SHX195

## 2016-10-02 HISTORY — PX: LAPAROSCOPIC GASTRIC SLEEVE RESECTION: SHX5895

## 2016-10-02 LAB — CBC
HCT: 35.5 % — ABNORMAL LOW (ref 36.0–46.0)
Hemoglobin: 10.4 g/dL — ABNORMAL LOW (ref 12.0–15.0)
MCH: 21.2 pg — ABNORMAL LOW (ref 26.0–34.0)
MCHC: 29.3 g/dL — ABNORMAL LOW (ref 30.0–36.0)
MCV: 72.4 fL — ABNORMAL LOW (ref 78.0–100.0)
Platelets: 461 10*3/uL — ABNORMAL HIGH (ref 150–400)
RBC: 4.9 MIL/uL (ref 3.87–5.11)
RDW: 16.7 % — ABNORMAL HIGH (ref 11.5–15.5)
WBC: 18 10*3/uL — ABNORMAL HIGH (ref 4.0–10.5)

## 2016-10-02 LAB — CREATININE, SERUM
Creatinine, Ser: 0.81 mg/dL (ref 0.44–1.00)
GFR calc Af Amer: >60 mL/min (ref >60)
GFR calc non Af Amer: >60 mL/min (ref >60)

## 2016-10-02 LAB — HEMOGLOBIN AND HEMATOCRIT, BLOOD
HCT: 34.9 % — ABNORMAL LOW (ref 36.0–46.0)
Hemoglobin: 10.4 g/dL — ABNORMAL LOW (ref 12.0–15.0)

## 2016-10-02 LAB — PREGNANCY, URINE: Preg Test, Ur: NEGATIVE

## 2016-10-02 SURGERY — GASTRECTOMY, SLEEVE, LAPAROSCOPIC
Anesthesia: General | Site: Abdomen

## 2016-10-02 MED ORDER — KCL IN DEXTROSE-NACL 20-5-0.45 MEQ/L-%-% IV SOLN
INTRAVENOUS | Status: DC
  Administered 2016-10-02 – 2016-10-03 (×2): via INTRAVENOUS
  Filled 2016-10-02 (×4): qty 1000

## 2016-10-02 MED ORDER — ACETAMINOPHEN 160 MG/5ML PO SOLN: 650.0000 mg | ORAL | Status: DC | PRN

## 2016-10-02 MED ORDER — ALBUTEROL SULFATE HFA 108 (90 BASE) MCG/ACT IN AERS
INHALATION_SPRAY | RESPIRATORY_TRACT | Status: AC
  Filled 2016-10-02: qty 6.7

## 2016-10-02 MED ORDER — MORPHINE SULFATE (PF) 2 MG/ML IV SOLN: 2.0000 mg | INTRAVENOUS | Status: DC | PRN

## 2016-10-02 MED ORDER — HEPARIN SODIUM (PORCINE) 5000 UNIT/ML IJ SOLN
5000.0000 [IU] | INTRAMUSCULAR | Status: AC
  Administered 2016-10-02: 5000 [IU] via SUBCUTANEOUS
  Filled 2016-10-02: qty 1

## 2016-10-02 MED ORDER — PREMIER PROTEIN SHAKE
2.0000 [oz_av] | ORAL | Status: DC
  Administered 2016-10-04 (×4): 2 [oz_av] via ORAL

## 2016-10-02 MED ORDER — DEXAMETHASONE SODIUM PHOSPHATE 10 MG/ML IJ SOLN
INTRAMUSCULAR | Status: AC
  Filled 2016-10-02: qty 1

## 2016-10-02 MED ORDER — HYDROMORPHONE HCL 1 MG/ML IJ SOLN
INTRAMUSCULAR | Status: DC | PRN
  Administered 2016-10-02 (×2): 1 mg via INTRAVENOUS

## 2016-10-02 MED ORDER — ALBUTEROL SULFATE (2.5 MG/3ML) 0.083% IN NEBU: 2.5000 mg | INHALATION_SOLUTION | Freq: Four times a day (QID) | RESPIRATORY_TRACT | Status: DC | PRN

## 2016-10-02 MED ORDER — SODIUM CHLORIDE 0.9 % IJ SOLN
INTRAMUSCULAR | Status: DC | PRN
  Administered 2016-10-02: 10 mL via INTRAVENOUS

## 2016-10-02 MED ORDER — BUPIVACAINE LIPOSOME 1.3 % IJ SUSP
20.0000 mL | Freq: Once | INTRAMUSCULAR | Status: AC
  Administered 2016-10-02: 20 mL
  Filled 2016-10-02: qty 20

## 2016-10-02 MED ORDER — PROPOFOL 10 MG/ML IV BOLUS
INTRAVENOUS | Status: AC
  Filled 2016-10-02: qty 20

## 2016-10-02 MED ORDER — HYDROMORPHONE HCL 1 MG/ML IJ SOLN
0.2500 mg | INTRAMUSCULAR | Status: DC | PRN
  Administered 2016-10-02 (×3): 0.5 mg via INTRAVENOUS

## 2016-10-02 MED ORDER — SUGAMMADEX SODIUM 500 MG/5ML IV SOLN
INTRAVENOUS | Status: DC | PRN
  Administered 2016-10-02: 500 mg via INTRAVENOUS

## 2016-10-02 MED ORDER — PROMETHAZINE HCL 25 MG/ML IJ SOLN
6.2500 mg | INTRAMUSCULAR | Status: AC | PRN
  Administered 2016-10-02: 6.25 mg via INTRAVENOUS
  Administered 2016-10-02: 12.5 mg via INTRAVENOUS

## 2016-10-02 MED ORDER — ACETAMINOPHEN 160 MG/5ML PO SOLN: 325.0000 mg | ORAL | Status: DC | PRN

## 2016-10-02 MED ORDER — CEFOTETAN DISODIUM-DEXTROSE 2-2.08 GM-% IV SOLR
INTRAVENOUS | Status: AC
  Filled 2016-10-02: qty 50

## 2016-10-02 MED ORDER — ONDANSETRON HCL 4 MG/2ML IJ SOLN
4.0000 mg | INTRAMUSCULAR | Status: DC | PRN
  Administered 2016-10-02 – 2016-10-04 (×8): 4 mg via INTRAVENOUS
  Filled 2016-10-02 (×8): qty 2

## 2016-10-02 MED ORDER — HYDROMORPHONE HCL 1 MG/ML IJ SOLN
INTRAMUSCULAR | Status: AC
  Filled 2016-10-02: qty 1

## 2016-10-02 MED ORDER — DEXAMETHASONE SODIUM PHOSPHATE 10 MG/ML IJ SOLN
INTRAMUSCULAR | Status: DC | PRN
  Administered 2016-10-02: 10 mg via INTRAVENOUS

## 2016-10-02 MED ORDER — LACTATED RINGERS IV SOLN
INTRAVENOUS | Status: DC
  Administered 2016-10-02 (×2): via INTRAVENOUS

## 2016-10-02 MED ORDER — SUCCINYLCHOLINE CHLORIDE 200 MG/10ML IV SOSY
PREFILLED_SYRINGE | INTRAVENOUS | Status: AC
  Filled 2016-10-02: qty 10

## 2016-10-02 MED ORDER — ROCURONIUM BROMIDE 50 MG/5ML IV SOSY
PREFILLED_SYRINGE | INTRAVENOUS | Status: AC
  Filled 2016-10-02: qty 5

## 2016-10-02 MED ORDER — ONDANSETRON HCL 4 MG/2ML IJ SOLN
INTRAMUSCULAR | Status: AC
  Filled 2016-10-02: qty 2

## 2016-10-02 MED ORDER — PROMETHAZINE HCL 25 MG/ML IJ SOLN
INTRAMUSCULAR | Status: AC
  Filled 2016-10-02: qty 1

## 2016-10-02 MED ORDER — PROPOFOL 10 MG/ML IV BOLUS
INTRAVENOUS | Status: DC | PRN
  Administered 2016-10-02: 200 mg via INTRAVENOUS

## 2016-10-02 MED ORDER — APREPITANT 80 MG PO CAPS
80.0000 mg | ORAL_CAPSULE | ORAL | Status: AC
  Administered 2016-10-02: 80 mg via ORAL
  Filled 2016-10-02: qty 1

## 2016-10-02 MED ORDER — FENTANYL CITRATE (PF) 250 MCG/5ML IJ SOLN
INTRAMUSCULAR | Status: DC | PRN
  Administered 2016-10-02 (×2): 50 ug via INTRAVENOUS
  Administered 2016-10-02: 150 ug via INTRAVENOUS

## 2016-10-02 MED ORDER — HEPARIN SODIUM (PORCINE) 5000 UNIT/ML IJ SOLN
5000.0000 [IU] | Freq: Three times a day (TID) | INTRAMUSCULAR | Status: DC
  Administered 2016-10-02 – 2016-10-04 (×5): 5000 [IU] via SUBCUTANEOUS
  Filled 2016-10-02 (×6): qty 1

## 2016-10-02 MED ORDER — LIDOCAINE HCL (CARDIAC) 20 MG/ML IV SOLN
INTRAVENOUS | Status: DC | PRN
  Administered 2016-10-02: 50 mg via INTRAVENOUS

## 2016-10-02 MED ORDER — ONDANSETRON HCL 4 MG/2ML IJ SOLN
INTRAMUSCULAR | Status: DC | PRN
  Administered 2016-10-02: 4 mg via INTRAVENOUS

## 2016-10-02 MED ORDER — CEFOTETAN DISODIUM-DEXTROSE 2-2.08 GM-% IV SOLR
2.0000 g | INTRAVENOUS | Status: AC
  Administered 2016-10-02: 2 g via INTRAVENOUS

## 2016-10-02 MED ORDER — MIDAZOLAM HCL 2 MG/2ML IJ SOLN
INTRAMUSCULAR | Status: AC
  Filled 2016-10-02: qty 2

## 2016-10-02 MED ORDER — HYDROMORPHONE HCL 1 MG/ML IJ SOLN
0.2500 mg | INTRAMUSCULAR | Status: DC | PRN
  Administered 2016-10-02 – 2016-10-03 (×8): 0.5 mg via INTRAVENOUS
  Filled 2016-10-02 (×8): qty 1

## 2016-10-02 MED ORDER — LIDOCAINE 2% (20 MG/ML) 5 ML SYRINGE
INTRAMUSCULAR | Status: AC
  Filled 2016-10-02: qty 5

## 2016-10-02 MED ORDER — IPRATROPIUM-ALBUTEROL 20-100 MCG/ACT IN AERS
INHALATION_SPRAY | RESPIRATORY_TRACT | Status: DC | PRN
  Administered 2016-10-02: 3 via RESPIRATORY_TRACT

## 2016-10-02 MED ORDER — SUCCINYLCHOLINE CHLORIDE 20 MG/ML IJ SOLN
INTRAMUSCULAR | Status: DC | PRN
  Administered 2016-10-02: 100 mg via INTRAVENOUS

## 2016-10-02 MED ORDER — OXYCODONE HCL 5 MG/5ML PO SOLN
5.0000 mg | ORAL | Status: DC | PRN
  Administered 2016-10-04 (×2): 5 mg via ORAL
  Filled 2016-10-02 (×2): qty 5

## 2016-10-02 MED ORDER — FENTANYL CITRATE (PF) 250 MCG/5ML IJ SOLN
INTRAMUSCULAR | Status: AC
  Filled 2016-10-02: qty 5

## 2016-10-02 MED ORDER — SCOPOLAMINE 1 MG/3DAYS TD PT72
1.0000 | MEDICATED_PATCH | TRANSDERMAL | Status: DC
  Administered 2016-10-02: 1.5 mg via TRANSDERMAL
  Filled 2016-10-02: qty 1

## 2016-10-02 MED ORDER — ROCURONIUM BROMIDE 100 MG/10ML IV SOLN
INTRAVENOUS | Status: DC | PRN
  Administered 2016-10-02: 10 mg via INTRAVENOUS
  Administered 2016-10-02: 50 mg via INTRAVENOUS

## 2016-10-02 SURGICAL SUPPLY — 70 items
ADH SKN CLS APL DERMABOND .7 (GAUZE/BANDAGES/DRESSINGS) ×2
APL SRG 32X5 SNPLK LF DISP (MISCELLANEOUS)
APPLICATOR COTTON TIP 6IN STRL (MISCELLANEOUS) IMPLANT
APPLIER CLIP 5 13 M/L LIGAMAX5 (MISCELLANEOUS)
APPLIER CLIP ROT 10 11.4 M/L (STAPLE)
APPLIER CLIP ROT 13.4 12 LRG (CLIP)
APR CLP LRG 13.4X12 ROT 20 MLT (CLIP)
APR CLP MED LRG 11.4X10 (STAPLE)
APR CLP MED LRG 5 ANG JAW (MISCELLANEOUS)
BAG SPEC RTRVL LRG 6X4 10 (ENDOMECHANICALS)
BLADE SURG 15 STRL LF DISP TIS (BLADE) ×2 IMPLANT
BLADE SURG 15 STRL SS (BLADE) ×3
CABLE HIGH FREQUENCY MONO STRZ (ELECTRODE) ×3 IMPLANT
CLIP APPLIE 5 13 M/L LIGAMAX5 (MISCELLANEOUS) IMPLANT
CLIP APPLIE ROT 10 11.4 M/L (STAPLE) IMPLANT
CLIP APPLIE ROT 13.4 12 LRG (CLIP) IMPLANT
COVER SURGICAL LIGHT HANDLE (MISCELLANEOUS) ×3 IMPLANT
DERMABOND ADVANCED (GAUZE/BANDAGES/DRESSINGS) ×1
DERMABOND ADVANCED .7 DNX12 (GAUZE/BANDAGES/DRESSINGS) IMPLANT
DEVICE SUT QUICK LOAD TK 5 (STAPLE) ×3 IMPLANT
DEVICE SUT TI-KNOT TK 5X26 (MISCELLANEOUS) ×1 IMPLANT
DEVICE SUTURE ENDOST 10MM (ENDOMECHANICALS) ×1 IMPLANT
DEVICE TROCAR PUNCTURE CLOSURE (ENDOMECHANICALS) ×3 IMPLANT
DISSECTOR BLUNT TIP ENDO 5MM (MISCELLANEOUS) IMPLANT
ELECT REM PT RETURN 9FT ADLT (ELECTROSURGICAL) ×3
ELECTRODE REM PT RTRN 9FT ADLT (ELECTROSURGICAL) ×2 IMPLANT
GAUZE SPONGE 4X4 12PLY STRL (GAUZE/BANDAGES/DRESSINGS) IMPLANT
GLOVE BIOGEL M 8.0 STRL (GLOVE) ×3 IMPLANT
GOWN STRL REUS W/TWL XL LVL3 (GOWN DISPOSABLE) ×12 IMPLANT
HANDLE STAPLE EGIA 4 XL (STAPLE) ×3 IMPLANT
HOVERMATT SINGLE USE (MISCELLANEOUS) ×3 IMPLANT
IRRIG SUCT STRYKERFLOW 2 WTIP (MISCELLANEOUS) ×3
IRRIGATION SUCT STRKRFLW 2 WTP (MISCELLANEOUS) ×2 IMPLANT
KIT BASIN OR (CUSTOM PROCEDURE TRAY) ×3 IMPLANT
MARKER SKIN DUAL TIP RULER LAB (MISCELLANEOUS) ×3 IMPLANT
NDL SPNL 22GX3.5 QUINCKE BK (NEEDLE) ×2 IMPLANT
NEEDLE SPNL 22GX3.5 QUINCKE BK (NEEDLE) ×3 IMPLANT
PACK UNIVERSAL I (CUSTOM PROCEDURE TRAY) ×3 IMPLANT
POUCH SPECIMEN RETRIEVAL 10MM (ENDOMECHANICALS) IMPLANT
RELOAD STAPLE 45 PURP MED/THCK (STAPLE) IMPLANT
RELOAD TRI 45 ART MED THCK BLK (STAPLE) ×3 IMPLANT
RELOAD TRI 45 ART MED THCK PUR (STAPLE) ×3 IMPLANT
RELOAD TRI 60 ART MED THCK BLK (STAPLE) ×3 IMPLANT
RELOAD TRI 60 ART MED THCK PUR (STAPLE) ×5 IMPLANT
SCISSORS LAP 5X45 EPIX DISP (ENDOMECHANICALS) IMPLANT
SEALANT SURGICAL APPL DUAL CAN (MISCELLANEOUS) IMPLANT
SHEARS HARMONIC ACE PLUS 45CM (MISCELLANEOUS) ×3 IMPLANT
SLEEVE ADV FIXATION 5X100MM (TROCAR) ×6 IMPLANT
SLEEVE GASTRECTOMY 36FR VISIGI (MISCELLANEOUS) ×3 IMPLANT
SOLUTION ANTI FOG 6CC (MISCELLANEOUS) ×3 IMPLANT
SPONGE LAP 18X18 X RAY DECT (DISPOSABLE) ×3 IMPLANT
STAPLER VISISTAT 35W (STAPLE) ×3 IMPLANT
SUT SURGIDAC NAB ES-9 0 48 120 (SUTURE) ×3 IMPLANT
SUT VIC AB 4-0 SH 18 (SUTURE) ×3 IMPLANT
SUT VICRYL 0 TIES 12 18 (SUTURE) ×3 IMPLANT
SYR 10ML ECCENTRIC (SYRINGE) ×3 IMPLANT
SYR 20CC LL (SYRINGE) ×3 IMPLANT
SYR 50ML LL SCALE MARK (SYRINGE) ×3 IMPLANT
SYSTEM WECK SHIELD CLOSURE (TROCAR) IMPLANT
TOWEL OR 17X26 10 PK STRL BLUE (TOWEL DISPOSABLE) ×6 IMPLANT
TOWEL OR NON WOVEN STRL DISP B (DISPOSABLE) ×3 IMPLANT
TRAY FOLEY W/METER SILVER 16FR (SET/KITS/TRAYS/PACK) IMPLANT
TROCAR ADV FIXATION 12X100MM (TROCAR) ×3 IMPLANT
TROCAR ADV FIXATION 5X100MM (TROCAR) ×3 IMPLANT
TROCAR BLADELESS 15MM (ENDOMECHANICALS) ×3 IMPLANT
TROCAR BLADELESS OPT 5 100 (ENDOMECHANICALS) ×3 IMPLANT
TUBE CALIBRATION LAPBAND (TUBING) IMPLANT
TUBING CONNECTING 10 (TUBING) ×3 IMPLANT
TUBING ENDO SMARTCAP (MISCELLANEOUS) ×3 IMPLANT
TUBING INSUF HEATED (TUBING) ×3 IMPLANT

## 2016-10-02 NOTE — Anesthesia Preprocedure Evaluation (Signed)
Anesthesia Evaluation  Patient identified by MRN, date of birth, ID band Patient awake    Reviewed: Allergy & Precautions, NPO status , Patient's Chart, lab work & pertinent test results  History of Anesthesia Complications (+) history of anesthetic complications  Airway Mallampati: II  TM Distance: >3 FB Neck ROM: Full    Dental no notable dental hx. (+) Dental Advisory Given   Pulmonary asthma ,    Pulmonary exam normal        Cardiovascular negative cardio ROS Normal cardiovascular exam     Neuro/Psych  Headaches, negative psych ROS   GI/Hepatic Neg liver ROS, GERD  Medicated and Controlled,  Endo/Other  Hypothyroidism   Renal/GU negative Renal ROS     Musculoskeletal   Abdominal   Peds  Hematology   Anesthesia Other Findings   Reproductive/Obstetrics                             Anesthesia Physical Anesthesia Plan  ASA: III  Anesthesia Plan: General   Post-op Pain Management:    Induction: Intravenous  Airway Management Planned: Oral ETT  Additional Equipment:   Intra-op Plan:   Post-operative Plan: Extubation in OR  Informed Consent: I have reviewed the patients History and Physical, chart, labs and discussed the procedure including the risks, benefits and alternatives for the proposed anesthesia with the patient or authorized representative who has indicated his/her understanding and acceptance.   Dental advisory given  Plan Discussed with: CRNA and Anesthesiologist  Anesthesia Plan Comments:         Anesthesia Quick Evaluation

## 2016-10-02 NOTE — Op Note (Signed)
Name:  Nicole Reed MRN: [id number] Date of Surgery: 10/02/2016  Preop Diagnosis:  Morbid Obesity  Postop Diagnosis:  Morbid Obesity (Weight - 319, BMI - 50.7), S/P Gastric Sleeve  Procedure:  Upper endoscopy  (Intraoperative)  Surgeon:  Ovidio Kinavid Milanni Ayub, M.D.  Anesthesia:  GET  Indications for procedure: Nicole SparkJennifer Amis is a 30 y.o. female whose primary care physician is Clayborn HeronVictoria R Rankins, MD and has completed a Gastric Sleeve today by Dr. Daphine DeutscherMartin.  I am doing an intraoperative upper endoscopy to evaluate the gastric pouch.  Operative Note: The patient is under general anesthesia.  Dr. Daphine DeutscherMartin is laparoscoping the patient while I do an upper endoscopy to evaluate the stomach pouch.  With the patient intubated, I passed the Olympus upper endoscope without difficulty down the esophagus.  The esophagus was unremarkable.  There was some clear bile stained fluid in the esophagus.  The esophago-gastric junction was at 39 cm.    The mucosa of the stomach looked viable and the staple line was intact without bleeding.  I advanced to the pylorus, but did not go through it.  While I insufflated the stomach pouch with air, Dr. Daphine DeutscherMartin  flooded the upper abdomen with saline to put the gastric pouch under saline.  There was no bubbling or evidence of a leak.  There was no evidence of narrowing of the pouch and the gastric sleeve looked tubular.  The scope was then withdrawn.  The esophagus was unremarkable and the patient tolerated the endoscopy without difficulty.  Ovidio Kinavid Natesha Hassey, MD, [facility] Pager: [phone] Office phone:  [phone]

## 2016-10-02 NOTE — Anesthesia Postprocedure Evaluation (Signed)
Anesthesia Post Note  Patient: Heide SparkJennifer Utz  Procedure(s) Performed: Procedure(s) (LRB): LAPAROSCOPIC GASTRIC SLEEVE RESECTION WITH UPPER ENDO (N/A) LAPAROSCOPIC REPAIR OF HIATAL HERNIA (N/A)  Patient location during evaluation: PACU Anesthesia Type: General Level of consciousness: sedated Pain management: pain level controlled Vital Signs Assessment: post-procedure vital signs reviewed and stable Respiratory status: spontaneous breathing and respiratory function stable Cardiovascular status: stable Anesthetic complications: no    Last Vitals:  Vitals:   10/02/16 1330 10/02/16 1345  BP: (!) 162/92 (!) 153/91  Pulse: 85 88  Resp: 15 17  Temp:      Last Pain:  Vitals:   10/02/16 1330  TempSrc:   PainSc: Asleep                 Starr Engel DANIEL

## 2016-10-02 NOTE — Anesthesia Procedure Notes (Signed)
Procedure Name: Intubation Date/Time: 10/02/2016 10:28 AM Performed by: Izora GalaHUGHES, Satya Bohall A Pre-anesthesia Checklist: Patient identified, Timeout performed, Emergency Drugs available, Suction available and Patient being monitored Patient Re-evaluated:Patient Re-evaluated prior to inductionOxygen Delivery Method: Circle system utilized Preoxygenation: Pre-oxygenation with 100% oxygen Intubation Type: IV induction Ventilation: Mask ventilation without difficulty Laryngoscope Size: Mac and 3 Grade View: Grade I Tube type: Oral Tube size: 7.5 mm Number of attempts: 1 Airway Equipment and Method: Stylet Secured at: 22 cm Tube secured with: Tape Dental Injury: Teeth and Oropharynx as per pre-operative assessment

## 2016-10-02 NOTE — Op Note (Signed)
Surgeon: Wenda LowMatt Odelle Kosier, MD, FACS  Asst:  Ovidio Kinavid Newman, MD, FACS  Anes:  General endotracheal  Procedure: Three suture posterior repair of hiatal hernia;  Laparoscopic sleeve gastrectomy and upper endoscopy  Diagnosis: Morbid obesity  Complications: none  EBL:   minimal cc  Description of Procedure:  The patient was take to OR 1 and given general anesthesia.  The abdomen was prepped with Technicare and draped sterilely.  A timeout was performed.  Access to the abdomen was achieved with a 5 mm Optiview without difficulty except that she was very thick (BMI 50).  Following insufflation, the state of the abdomen was found to be free of adhesions but she did have a large left lateral segment.  The patient had GERD and a hiatal hernia was visible.  A posterior dissection was performed and two sutures were initially placed and the baloon test failed.  A third suture was placed and this snugged the hiatus adequately to the balloon test.  The tube was removed  The ViSiGi 36Fr tube was inserted to deflate the stomach and was pulled back into the esophagus.    The pylorus was identified and we measured 5 cm back and marked the antrum.  At that point we began dissection to take down the greater curvature of the stomach using the Harmonic scalpel.  This dissection was taken all the way up to the left crus.  Posterior attachments of the stomach were also taken down.    The ViSiGi tube was then passed into the antrum and suction applied so that it was snug along the lessor curvature.  The "crow's foot" or incisura was identified.  The sleeve gastrectomy was begun using the [employer] platform stapler beginning with a 4.5 cm black load with TRS.  This was followed by a 6 cm .black load with TRS and then subsequent firings with 6 cm purple loads with TRS.    When the sleeve was complete the tube was taken off suction and insufflated briefly.  The tube was withdrawn.  Upper endoscopy was then performed by Dr.  Ezzard StandingNewman.     The specimen was extracted through the 15 trocar site.  Wounds were infiltrated with Exparel  and closed with 4-0 monocryl and Dermabond.  The 15 mm port site was closed with the weck closure device and a 0 vicryl.    Matt B. Daphine DeutscherMartin, MD, [facility], [place] [phone]

## 2016-10-02 NOTE — H&P (View-Only) (Signed)
Nicole Reed [facility] Patient #: [id number] DOB: 1986/08/28 Married / Language: English / Race: White Female   History of Present Illness  The patient is a 30 year old female who presented for a bariatric surgery evaluation. Nicole Reed has been to our online seminar and is interested in a sleeve gastrectomy because she has 2 friends that have had the surgery. [facility] and her Obgyn Judye BosRick Tavoon has placed her on Phentermine to help her lose weight. She took this but it started interfering with sleep. She and her husband have been trying to get pregnant and she feels like it is her BMI of 58. She has tried many diets including HCG shots which caused her to lose 30 lbs. She does have GER if she doesn't take omeprazole. UGI showed a small hiatal lhernia  We discussed bypass and sleeve and after our discussion she was pretty certain that she wanted a sleeve gastrectomy and this was discussed with her in detail. She is scheduled for sleeve gastrectomy on Nov 27.     Other Problems  Asthma Migraine Headache Thyroid Disease  Past Surgical History  Gallbladder Surgery - Laparoscopic  Diagnostic Studies History  Colonoscopy never Mammogram never Pap Smear 1-5 years ago  Allergies  Codeine Sulfate *ANALGESICS - OPIOID*  Medication History  Levothyroxine Sodium (200MCG Tablet, Oral) Active. Atenolol (50MG  Tablet, Oral) Active. Omeprazole (20MG  Capsule DR, Oral) Active. Medications Reconciled  Social History Alcohol use Moderate alcohol use. Caffeine use Carbonated beverages. No drug use Tobacco use Never smoker.  Family History  Breast Cancer Mother, Sister. Diabetes Mellitus Father, Mother. Hypertension Father, Mother. Migraine Headache Sister. Thyroid problems Sister.  Pregnancy / Birth History  Age at menarche 10 years. Contraceptive History Oral contraceptives. Gravida  0 Para 0 Regular periods    Review of Systems  General Not Present- Appetite Loss, Chills, Fatigue, Fever, Night Sweats, Weight Gain and Weight Loss. Skin Not Present- Change in Wart/Mole, Dryness, Hives, Jaundice, New Lesions, Non-Healing Wounds, Rash and Ulcer. HEENT Not Present- Earache, Hearing Loss, Hoarseness, Nose Bleed, Oral Ulcers, Ringing in the Ears, Seasonal Allergies, Sinus Pain, Sore Throat, Visual Disturbances, Wears glasses/contact lenses and Yellow Eyes. Respiratory Not Present- Bloody sputum, Chronic Cough, Difficulty Breathing, Snoring and Wheezing. Breast Not Present- Breast Mass, Breast Pain, Nipple Discharge and Skin Changes. Cardiovascular Present- Swelling of Extremities. Not Present- Chest Pain, Difficulty Breathing Lying Down, Leg Cramps, Palpitations, Rapid Heart Rate and Shortness of Breath. Gastrointestinal Not Present- Abdominal Pain, Bloating, Bloody Stool, Change in Bowel Habits, Chronic diarrhea, Constipation, Difficulty Swallowing, Excessive gas, Gets full quickly at meals, Hemorrhoids, Indigestion, Nausea, Rectal Pain and Vomiting. Female Genitourinary Not Present- Frequency, Nocturia, Painful Urination, Pelvic Pain and Urgency. Musculoskeletal Not Present- Back Pain, Joint Pain, Joint Stiffness, Muscle Pain, Muscle Weakness and Swelling of Extremities. Neurological Not Present- Decreased Memory, Fainting, Headaches, Numbness, Seizures, Tingling, Tremor, Trouble walking and Weakness. Psychiatric Not Present- Anxiety, Bipolar, Change in Sleep Pattern, Depression, Fearful and Frequent crying. Endocrine Not Present- Cold Intolerance, Excessive Hunger, Hair Changes, Heat Intolerance, Hot flashes and New Diabetes. Hematology Not Present- Easy Bruising, Excessive bleeding, Gland problems, HIV and Persistent Infections.  Vitals  Weight: 319 Height: 66.5in Body Surface Area: 2.6 m Body Mass Index: 50.72 Temp.: 98.25F(Oral)  Pulse: 60 (Regular)  BP:  118/70 (Sitting, Left Arm, Standard)       Physical Exam  General  Has lost ~45 lbs since consultation Note: Obese WFNAD HEENT unremarkable  Neck supple Chest clear Heart SR Abdomen-prior lap chole Ext-symmetrical swelling in her feet     Assessment & Plan Molli Hazard(Miraj Truss B. Daphine DeutscherMartin MD; 03/24/2016 12:03 PM) MORBID OBESITY WITH BMI OF 50.0-59.9, ADULT (E66.01)  GERD on omeprazole  Lap sleeve gastrectomy with hiatal hernia repair  Matt B. Daphine DeutscherMartin, MD, FACS

## 2016-10-02 NOTE — Transfer of Care (Signed)
Immediate Anesthesia Transfer of Care Note  Patient: Nicole Reed  Procedure(s) Performed: Procedure(s): LAPAROSCOPIC GASTRIC SLEEVE RESECTION WITH UPPER ENDO (N/A) LAPAROSCOPIC REPAIR OF HIATAL HERNIA (N/A)  Patient Location: PACU  Anesthesia Type:General  Level of Consciousness: awake, alert  and oriented  Airway & Oxygen Therapy: Patient Spontanous Breathing and Patient connected to face mask oxygen  Post-op Assessment: Report given to RN and Post -op Vital signs reviewed and stable  Post vital signs: Reviewed and stable  Last Vitals:  Vitals:   10/02/16 0759 10/02/16 1238  BP: (!) 149/80 (!) 161/94  Pulse: 87 97  Resp: 16   Temp: 37.1 C     Last Pain:  Vitals:   10/02/16 0759  TempSrc: Oral      Patients Stated Pain Goal: 4 (10/02/16 0848)  Complications: No apparent anesthesia complications

## 2016-10-02 NOTE — Interval H&P Note (Signed)
History and Physical Interval Note:  10/02/2016 10:03 AM  Nicole Reed  has presented today for surgery, with the diagnosis of MORBID OBESITY  The various methods of treatment have been discussed with the patient and family. After consideration of risks, benefits and other options for treatment, the patient has consented to  Procedure(s): LAPAROSCOPIC GASTRIC SLEEVE RESECTION WITH UPPER ENDO (N/A) as a surgical intervention .  The patient's history has been reviewed, patient examined, no change in status, stable for surgery.  I have reviewed the patient's chart and labs.  Questions were answered to the patient's satisfaction.     Jessenia Filippone B

## 2016-10-02 NOTE — Discharge Instructions (Addendum)
Aprepitant Discharge Instructions  °On the day of surgery you were given the medication aprepitant. This medication interacts with hormonal forms of birth control (oral contraceptives and injected or implanted birth control) and may make them ineffective. °IF YOU USE ANY HORMONAL FORM OF BIRTH CONTROL, YOU MUST USE AN ADDITIONAL BARRIER BIRTH CONTROL METHOD FOR ONE MONTH after receiving aprepitant or there is a chance you could become pregnant. ° ° ° ° °GASTRIC BYPASS/SLEEVE ° Home Care Instructions ° ° These instructions are to help you care for yourself when you go home. ° °Call: If you have any problems. °• Call [phone] and ask for the surgeon on call °• If you need immediate assistance come to the ER at [facility]. Tell the ER staff you are a new post-op gastric bypass or gastric sleeve patient  °Signs and symptoms to report: • Severe  vomiting or nausea °o If you cannot handle clear liquids for longer than 1 day, call your surgeon °• Abdominal pain which does not get better after taking your pain medication °• Fever greater than 100.4°  F and chills °• Heart rate over 100 beats a minute °• Trouble breathing °• Chest pain °• Redness,  swelling, drainage, or foul odor at incision (surgical) sites °• If your incisions open or pull apart °• Swelling or pain in calf (lower leg) °• Diarrhea (Loose bowel movements that happen often), frequent watery, uncontrolled bowel movements °• Constipation, (no bowel movements for 3 days) if this happens: °o Take Milk of Magnesia, 2 tablespoons by mouth, 3 times a day for 2 days if needed °o Stop taking Milk of Magnesia once you have had a bowel movement °o Call your doctor if constipation continues °Or °o Take Miralax  (instead of Milk of Magnesia) following the label instructions °o Stop taking Miralax once you have had a bowel movement °o Call your doctor if constipation continues °• Anything you think is “abnormal for you” °  °Normal side effects after surgery: • Unable  to sleep at night or unable to concentrate °• Irritability °• Being tearful (crying) or depressed ° °These are common complaints, possibly related to your anesthesia, stress of surgery, and change in lifestyle, that usually go away a few weeks after surgery. If these feelings continue, call your medical doctor.  °Wound Care: You may have surgical glue, steri-strips, or staples over your incisions after surgery °• Surgical glue: Looks like clear film over your incisions and will wear off a little at a time °• Steri-strips: Adhesive strips of tape over your incisions. You may notice a yellowish color on skin under the steri-strips. This is used to make the steri-strips stick better. Do not pull the steri-strips off - let them fall off °• Staples: Staples may be removed before you leave the hospital °o If you go home with staples, call [facility] for an appointment with your surgeon’s nurse to have staples removed 10 days after surgery, [phone] °• Showering: You may shower two (2) days after your surgery unless your surgeon tells you differently °o Wash gently around incisions with warm soapy water, rinse well, and gently pat dry °o If you have a drain (tube from your incision), you may need someone to hold this while you shower °o No tub baths until staples are removed and incisions are healed °  °Medications: • Medications should be liquid or crushed if larger than the size of a dime °• Extended release pills (medication that releases a little bit at   a time through the  day) should not be crushed °• Depending on the size and number of medications you take, you may need to space (take a few throughout the day)/change the time you take your medications so that you do not over-fill your pouch (smaller stomach) °• Make sure you follow-up with you primary care physician to make medication changes needed during rapid weight loss and life -style changes °• If you have diabetes, follow up with your  doctor that orders your diabetes medication(s) within one week after surgery and check your blood sugar regularly ° °• Do not drive while taking narcotics (pain medications) ° °• Do not take acetaminophen (Tylenol) and Roxicet or Lortab Elixir at the same time since these pain medications contain acetaminophen °  °Diet:  °First 2 Weeks You will see the nutritionist about two (2) weeks after your surgery. The nutritionist will increase the types of foods you can eat if you are handling liquids well: °• If you have severe vomiting or nausea and cannot handle clear liquids lasting longer than 1 day call your surgeon °Protein Shake °• Drink at least 2 ounces of shake 5-6 times per day °• Each serving of protein shakes (usually 8-12 ounces) should have a minimum of: °o 15 grams of protein °o And no more than 5 grams of carbohydrate °• Goal for protein each day: °o Men = 80 grams per day °o Women = 60 grams per day °  ° • Protein powder may be added to fluids such as non-fat milk or Lactaid milk or Soy milk (limit to 35 grams added protein powder per serving) ° °Hydration °• Slowly increase the amount of water and other clear liquids as tolerated (See Acceptable Fluids) °• Slowly increase the amount of protein shake as tolerated °• Sip fluids slowly and throughout the day °• May use sugar substitutes in small amounts (no more than 6-8 packets per day; i.e. Splenda) ° °Fluid Goal °• The first goal is to drink at least 8 ounces of protein shake/drink per day (or as directed by the nutritionist); some examples of protein shakes are Syntrax Nectar, Adkins Advantage, EAS Edge HP, and Unjury. - See handout from pre-op Bariatric Education Class: °o Slowly increase the amount of protein shake you drink as tolerated °o You may find it easier to slowly sip shakes throughout the day °o It is important to get your proteins in first °• Your fluid goal is to drink 64-100 ounces of fluid daily °o It may take a few weeks to build up to  this  °• 32 oz. (or more) should be clear liquids °And °• 32 oz. (or more) should be full liquids (see below for examples) °• Liquids should not contain sugar, caffeine, or carbonation ° °Clear Liquids: °• Water of Sugar-free flavored water (i.e. Fruit H²O, Propel) °• Decaffeinated coffee or tea (sugar-free) °• Crystal lite, Wyler’s Lite, Minute Maid Lite °• Sugar-free Jell-O °• Bouillon or broth °• Sugar-free Popsicle:    - Less than 20 calories each; Limit 1 per day ° °Full Liquids: °                  Protein Shakes/Drinks + 2 choices per day of other full liquids °• Full liquids must be: °o No More Than 12 grams of Carbs per serving °o No More Than 3 grams of Fat per serving °• Strained low-fat cream soup °• Non-Fat milk °• Fat-free Lactaid Milk °• Sugar-free yogurt (Dannon Lite & Fit, Greek   yogurt) ° °  °Vitamins and Minerals • Start 1 day after surgery unless otherwise directed by your surgeon °• 2 Chewable Multivitamin / Multimineral Supplement with iron (i.e. Centrum for Adults) °• Vitamin B-12, 350-500 micrograms sub-lingual (place tablet under the tongue) each day °• Chewable Calcium Citrate with Vitamin D-3 °(Example: 3 Chewable Calcium  Plus 600 with Vitamin D-3) °o Take 500 mg three (3) times a day for a total of 1500 mg each day °o Do not take all 3 doses of calcium at one time as it may cause constipation, and you can only absorb 500 mg at a time °o Do not mix multivitamins containing iron with calcium supplements;  take 2 hours apart °o Do not substitute Tums (calcium carbonate) for your calcium °• Menstruating women and those at risk for anemia ( a blood disease that causes weakness) may need extra iron °o Talk to your doctor to see if you need more iron °• If you need extra iron: Total daily Iron recommendation (including Vitamins) is 50 to 100 mg Iron/day °• Do not stop taking or change any vitamins or minerals until you talk to your nutritionist or surgeon °• Your nutritionist and/or surgeon must  approve all vitamin and mineral supplements °  °Activity and Exercise: It is important to continue walking at home. Limit your physical activity as instructed by your doctor. During this time, use these guidelines: °• Do not lift anything greater than ten  (10) pounds for at least two (2) weeks °• Do not go back to work or drive until your surgeon says you can °• You may have sex when you feel comfortable °o It is VERY important for female patients to use a reliable birth control method; fertility often increase after surgery °o Do not get pregnant for at least 18 months °• Start exercising as soon as your doctor tells you that you can °o Make sure your doctor approves any physical activity °• Start with a simple walking program °• Walk 5-15 minutes each day, 7 days per week °• Slowly increase until you are walking 30-45 minutes per day °• Consider joining our BELT program. [phone] or email [email or web address] °  °Special Instructions Things to remember: °• Free counseling is available for you and your family through collaboration between [place] and [employer]. Please call [phone] and leave a message °• Use your CPAP when sleeping if this applies to you °• Consider buying a medical alert bracelet that says you had lap-band surgery °  °  You will likely have your first fill (fluid added to your band) 6 - 8 weeks after surgery °• [facility] has a free Bariatric Surgery Support Group that meets monthly, the 3rd Thursday, 6pm. [employer] [facility] Classrooms. You can see classes online at [email or web address] °• It is very important to keep all follow up appointments with your surgeon, nutritionist, primary care physician, and behavioral health practitioner °o After the first year, please follow up with your bariatric surgeon and nutritionist at least once a year in order to maintain best weight loss results °      °             [facility]:  [phone] ° °             [facility]: [phone] ° °             Bariatric Nurse Coordinator: [phone]  °Gastric Bypass/Sleeve Home Care   Instructions  Rev. 12/2012    ° °                                                    Reviewed and Endorsed °                                                   by [facility] Patient Education Committee, Jan, 2014 ° ° ° ° ° ° ° ° ° °

## 2016-10-03 ENCOUNTER — Encounter (HOSPITAL_COMMUNITY): Payer: Self-pay

## 2016-10-03 LAB — CBC WITH DIFFERENTIAL/PLATELET
Basophils Absolute: 0 10*3/uL (ref 0.0–0.1)
Basophils Relative: 0 %
Eosinophils Absolute: 0 10*3/uL (ref 0.0–0.7)
Eosinophils Relative: 0 %
HCT: 32.4 % — ABNORMAL LOW (ref 36.0–46.0)
Hemoglobin: 9.9 g/dL — ABNORMAL LOW (ref 12.0–15.0)
Lymphocytes Relative: 8 %
Lymphs Abs: 1.4 10*3/uL (ref 0.7–4.0)
MCH: 21.5 pg — ABNORMAL LOW (ref 26.0–34.0)
MCHC: 30.6 g/dL (ref 30.0–36.0)
MCV: 70.3 fL — ABNORMAL LOW (ref 78.0–100.0)
Monocytes Absolute: 1 10*3/uL (ref 0.1–1.0)
Monocytes Relative: 6 %
Neutro Abs: 14.9 10*3/uL — ABNORMAL HIGH (ref 1.7–7.7)
Neutrophils Relative %: 86 %
Platelets: 450 10*3/uL — ABNORMAL HIGH (ref 150–400)
RBC: 4.61 MIL/uL (ref 3.87–5.11)
RDW: 16.4 % — ABNORMAL HIGH (ref 11.5–15.5)
WBC: 17.3 10*3/uL — ABNORMAL HIGH (ref 4.0–10.5)

## 2016-10-03 LAB — HEMOGLOBIN AND HEMATOCRIT, BLOOD
HCT: 34.1 % — ABNORMAL LOW (ref 36.0–46.0)
Hemoglobin: 10.2 g/dL — ABNORMAL LOW (ref 12.0–15.0)

## 2016-10-03 MED FILL — oxyCODONE HCL 5 MG/5ML SOLN: 5 | 3 days supply | Qty: 200 | Fill #0

## 2016-10-03 NOTE — Progress Notes (Signed)
Patient alert and oriented, Post op day 1.  Provided support and encouragement.  Encouraged pulmonary toilet, ambulation and small sips of liquids.  All questions answered.  Will continue to monitor. 

## 2016-10-03 NOTE — Plan of Care (Signed)
Problem: Food- and Nutrition-Related Knowledge Deficit (NB-1.1) Goal: Nutrition education Formal process to instruct or train a patient/client in a skill or to impart knowledge to help patients/clients voluntarily manage or modify food choices and eating behavior to maintain or improve health. Outcome: Completed/Met Date Met: 10/03/16 Nutrition Education Note  Received consult for diet education per DROP protocol.   Discussed 2 week post op diet with pt. Emphasized that liquids must be non carbonated, non caffeinated, and sugar free. Fluid goals discussed. Reviewed progression of diet to include soft proteins at 7-10 days post-op. Pt to follow up with outpatient bariatric RD for further diet progression after 2 weeks. Multivitamins and minerals also reviewed. Teach back method used, pt expressed understanding, expect good compliance.   Diet: First 2 Weeks  You will see the dietitian about two (2) weeks after your surgery. The dietitian will increase the types of foods you can eat if you are handling liquids well:  If you have severe vomiting or nausea and cannot handle clear liquids lasting longer than 1 day, call your surgeon  Protein Shake  Drink at least 2 ounces of shake 5-6 times per day  Each serving of protein shakes (usually 8 - 12 ounces) should have a minimum of:  15 grams of protein  And no more than 5 grams of carbohydrate  Goal for protein each day:  Men = 80 grams per day  Women = 60 grams per day  Protein powder may be added to fluids such as non-fat milk or Lactaid milk or Soy milk (limit to 35 grams added protein powder per serving)   Hydration  Slowly increase the amount of water and other clear liquids as tolerated (See Acceptable Fluids)  Slowly increase the amount of protein shake as tolerated  Sip fluids slowly and throughout the day  May use sugar substitutes in small amounts (no more than 6 - 8 packets per day; i.e. Splenda)   Fluid Goal  The first goal is to  drink at least 8 ounces of protein shake/drink per day (or as directed by the nutritionist); some examples of protein shakes are Syntrax Nectar, Adkins Advantage, EAS Edge HP, and Unjury. See handout from pre-op Bariatric Education Class:  Slowly increase the amount of protein shake you drink as tolerated  You may find it easier to slowly sip shakes throughout the day  It is important to get your proteins in first  Your fluid goal is to drink 64 - 100 ounces of fluid daily  It may take a few weeks to build up to this  32 oz (or more) should be clear liquids  And  32 oz (or more) should be full liquids (see below for examples)  Liquids should not contain sugar, caffeine, or carbonation   Clear Liquids:  Water or Sugar-free flavored water (i.e. Fruit H2O, Propel)  Decaffeinated coffee or tea (sugar-free)  Crystal Lite, Wyler's Lite, Minute Maid Lite  Sugar-free Jell-O  Bouillon or broth  Sugar-free Popsicle: *Less than 20 calories each; Limit 1 per day   Full Liquids:  Protein Shakes/Drinks + 2 choices per day of other full liquids  Full liquids must be:  No More Than 12 grams of Carbs per serving  No More Than 3 grams of Fat per serving  Strained low-fat cream soup  Non-Fat milk  Fat-free Lactaid Milk  Sugar-free yogurt (Dannon Lite & Fit, Greek yogurt)     Daymian Lill Graef, MS, RD, LDN Pager: [phone] After Hours Pager: [phone]    

## 2016-10-04 LAB — CBC WITH DIFFERENTIAL/PLATELET
Basophils Absolute: 0 10*3/uL (ref 0.0–0.1)
Basophils Relative: 0 %
Eosinophils Absolute: 0 10*3/uL (ref 0.0–0.7)
Eosinophils Relative: 0 %
HCT: 32.7 % — ABNORMAL LOW (ref 36.0–46.0)
Hemoglobin: 9.8 g/dL — ABNORMAL LOW (ref 12.0–15.0)
Lymphocytes Relative: 19 %
Lymphs Abs: 3.3 10*3/uL (ref 0.7–4.0)
MCH: 21.7 pg — ABNORMAL LOW (ref 26.0–34.0)
MCHC: 30 g/dL (ref 30.0–36.0)
MCV: 72.5 fL — ABNORMAL LOW (ref 78.0–100.0)
Monocytes Absolute: 1.4 10*3/uL — ABNORMAL HIGH (ref 0.1–1.0)
Monocytes Relative: 8 %
Neutro Abs: 12.7 10*3/uL — ABNORMAL HIGH (ref 1.7–7.7)
Neutrophils Relative %: 73 %
Platelets: 452 10*3/uL — ABNORMAL HIGH (ref 150–400)
RBC: 4.51 MIL/uL (ref 3.87–5.11)
RDW: 17 % — ABNORMAL HIGH (ref 11.5–15.5)
WBC: 17.4 10*3/uL — ABNORMAL HIGH (ref 4.0–10.5)

## 2016-10-04 MED ORDER — PROMETHAZINE HCL 25 MG/ML IJ SOLN
12.5000 mg | Freq: Four times a day (QID) | INTRAMUSCULAR | Status: DC | PRN
Start: 2016-10-04 — End: 2016-10-04
  Administered 2016-10-04: 12.5 mg via INTRAVENOUS
  Filled 2016-10-04: qty 1

## 2016-10-04 NOTE — Progress Notes (Signed)
Discharge instructions discussed with patient and family, verbalized agreement and understanding 

## 2016-10-04 NOTE — Progress Notes (Signed)
Patient alert and oriented, pain is controlled. Patient is tolerating fluids, advanced to protein shake today, patient is tolerating well.  Reviewed Gastric sleeve discharge instructions with patient and patient is able to articulate understanding.  Provided information on BELT program, Support Group and [facility] outpatient pharmacy. All questions answered, will continue to monitor.  

## 2016-10-04 NOTE — Discharge Summary (Signed)
Physician Discharge Summary  Patient ID: Nicole Reed MRN: [id number] DOB/AGE: 05/01/1986 30 y.o.  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  Some nausea on PD 1.  Begun on clears and advanced to shakes before discharge.  WBC noted at 17 K and holding.  No other constitutional symptoms  Consults: none  Significant Diagnostic Studies: none    Discharge Exam: Blood pressure (!) 146/87, pulse 84, temperature 98.3 F (36.8 C), temperature source Oral, resp. rate 18, height 5\' 6"  (1.676 m), weight (!) 140.2 kg (309 lb 2 oz), last menstrual period 09/12/2016, SpO2 96 %. Incisions OK  Disposition: Final discharge disposition not confirmed  Discharge Instructions    Ambulate hourly while awake    Complete by:  As directed    Call MD for:  difficulty breathing, headache or visual disturbances    Complete by:  As directed    Call MD for:  persistant dizziness or light-headedness    Complete by:  As directed    Call MD for:  persistant nausea and vomiting    Complete by:  As directed    Call MD for:  redness, tenderness, or signs of infection (pain, swelling, redness, odor or green/yellow discharge around incision site)    Complete by:  As directed    Call MD for:  severe uncontrolled pain    Complete by:  As directed    Call MD for:  temperature >101 F    Complete by:  As directed    Diet bariatric full liquid    Complete by:  As directed    Incentive spirometry    Complete by:  As directed    Perform hourly while awake       Medication List    TAKE these medications   albuterol 108 (90  Base) MCG/ACT inhaler Commonly known as:  PROVENTIL HFA;VENTOLIN HFA Inhale 2 puffs into the lungs every 6 (six) hours as needed for wheezing or shortness of breath.   FIBER ADULT GUMMIES PO Take 2 tablets by mouth at bedtime.   levothyroxine 200 MCG tablet Commonly known as:  SYNTHROID, LEVOTHROID Take 200 mcg by mouth daily before breakfast.   multivitamin tablet Take 1 tablet by mouth daily. Bariatric Advantage   omeprazole 20 MG capsule Commonly known as:  PRILOSEC Take 20 mg by mouth daily.   STOOL SOFTENER PO Take 2 tablets by mouth at bedtime.      Follow-up Information    Valarie MerinoMARTIN,Marise Knapper B, MD. Go on 10/18/2016.   Specialty:  General Surgery Why:  at 3:00 PM for post-op check Contact information: [address] [place] [address] [phone]        Valarie MerinoMARTIN,Buell Parcel B, MD Follow up.   Specialty:  General Surgery Contact information: [address] [place] [address] [phone]           Signed: Valarie MerinoMARTIN,Leomia Blake B 10/04/2016, 2:19 PM

## 2016-10-05 ENCOUNTER — Telehealth: Payer: Self-pay | Admitting: General Surgery

## 2016-10-05 NOTE — Telephone Encounter (Signed)
She called stating that she was running a fever of 99.7.  She reported that her wounds looked good.  She had one loose stool today but none since then.  No nausea or vomiting.  I encouraged her to do deep breathing exercises and the call back if the temperature was 101 degrees or greater.

## 2016-10-10 ENCOUNTER — Telehealth (HOSPITAL_COMMUNITY): Payer: Self-pay

## 2016-10-10 NOTE — Telephone Encounter (Signed)
Made discharge phone call to patient per DROP protocol. Asking the following questions.    1. Do you have someone to care for you now that you are home?   2. Are you having pain now that is not relieved by your pain medication?  no 3. Are you able to drink the recommended daily amount of fluids (48 ounces minimum/day) and protein (60-80 grams/day) as prescribed by the dietitian or nutritional counselor?  Almost there, some days are a little less  4. Are you taking the vitamins and minerals as prescribed?  yes 5. Do you have the "on call" number to contact your surgeon if you have a problem or question?  yes 6. Are your incisions free of redness, swelling or drainage? (If steri strips, address that these can fall off, shower as tolerated) yes 7. Have your bowels moved since your surgery?  If not, are you passing gas?  Diarrhea for 2 days post-op 8. Are you up and walking 3-4 times per day?  yes 9. Were you provided your discharge medications before your surgery or before you were discharged from the hospital and are you taking them without problem?  yes

## 2016-10-17 ENCOUNTER — Encounter: Payer: BLUE CROSS/BLUE SHIELD | Attending: Surgery | Admitting: Dietician

## 2016-10-17 DIAGNOSIS — Z713 Dietary counseling and surveillance: Secondary | ICD-10-CM | POA: Insufficient documentation

## 2016-10-18 NOTE — Progress Notes (Signed)
Bariatric Class:  Appt start time: 1530 end time:  1630.  2 Week Post-Operative Nutrition Class  Patient was seen on 10/18/16 for Post[facility]ative [facility]   Surgery date: 10/02/2016 Surgery type: sleeve gastrectomy [facility]: 366 lbs  Weight today: 292.0 lbs  Weight change: 25.8 lbs  TANITA  BODY COMP RESULTS  09/11/16 10/18/16   BMI (kg/m^2) 51.3 47.1   Fat Mass (lbs) 172.6 155.6   Fat Free Mass (lbs) 145.2 136.4   Total Body Water (lbs) 109.4 102   The following the learning objectives were met by the patient during this course:  Identifies Phase 3A (Soft, High Proteins) Dietary Goals and will begin from 2 weeks post-operatively to 2 months post-operatively  Identifies appropriate sources of fluids and proteins   States protein recommendations and appropriate sources post-operatively  Identifies the need for appropriate texture modifications, mastication, and bite sizes when consuming solids  Identifies appropriate multivitamin and calcium sources post-operatively  Describes the need for physical activity post-operatively and will follow MD recommendations  States when to call healthcare provider regarding medication questions or post-operative complications  Handouts given during class include:  Phase 3A: Soft, High Protein Diet Handout  Follow-Up Plan: Patient will follow-up at [facility] in 6 weeks for 2 month post-op nutrition visit for diet advancement per MD.

## 2016-11-06 DIAGNOSIS — S060X9A Concussion with loss of consciousness of unspecified duration, initial encounter: Secondary | ICD-10-CM

## 2016-11-06 DIAGNOSIS — R42 Dizziness and giddiness: Secondary | ICD-10-CM

## 2016-11-06 DIAGNOSIS — S060XAA Concussion with loss of consciousness status unknown, initial encounter: Secondary | ICD-10-CM

## 2016-11-06 HISTORY — DX: Concussion with loss of consciousness of unspecified duration, initial encounter: S06.0X9A

## 2016-11-06 HISTORY — DX: Concussion with loss of consciousness status unknown, initial encounter: S06.0XAA

## 2016-11-06 HISTORY — DX: Dizziness and giddiness: R42

## 2016-11-28 ENCOUNTER — Encounter: Payer: BLUE CROSS/BLUE SHIELD | Attending: Surgery | Admitting: Dietician

## 2016-11-28 DIAGNOSIS — Z713 Dietary counseling and surveillance: Secondary | ICD-10-CM | POA: Diagnosis not present

## 2016-11-28 NOTE — Progress Notes (Signed)
  Follow-up visit:  8 Weeks Post-Operative sleeve gastrectomy Surgery  Medical Nutrition Therapy:  Appt start time: 330 end time:  415  Primary concerns today: Post-operative Bariatric Surgery Nutrition Management. Britt BoozerJenn returns having lost another 16 pounds in the last 6 weeks. She states she is feeling well overall. Has noticed her weight loss plateauing. Tolerating all recommended foods. Has vomited twice and thinks it was due to having one bite too many. Has been listening to her hunger and fullness cues. Starting to weigh her food and feels like this is helping her eat appropriate portions. Can eat 3-4 oz meat at a time. Enjoying her nonscale victories.   Surgery date: 10/02/2016 Surgery type: sleeve gastrectomy [facility] lbs  Weight today: 276.6 lbs Weight change: 16 lbs Total weight lost: 90 lbs  TANITA  BODY COMP RESULTS  09/11/16 10/18/16 11/28/16   BMI (kg/m^2) 51.3 47.1 44.6   Fat Mass (lbs) 172.6 155.6 132   Fat Free Mass (lbs) 145.2 136.4 144.6   Total Body Water (lbs) 109.4 102 107.2    Preferred Learning Style:   No preference indicated   Learning Readiness:   Ready  24-hr recall: B (AM): Atkins shake (15g) Snk (10 AM): cheese stick (6g)  L (PM): Malaw[place]urkey meat with cheese (10-14g) Snk (PM): none  D (PM): 2.5-4 oz burger or pulled pork (18-28g) Snk (PM):   Fluid intake: at least 64 oz water and protein shake Estimated total protein intake: 60+ grams  Medications: see list Supplementation: taking, sometimes forgets  Drinking while eating: no Hair loss: a little bit Carbonated beverages: no N/V/D/C: occasional diarrhea, no constipation Dumping syndrome: yes, after drinking a banana Premier shake with PB2  Recent physical activity:  Just signed up at [employer]; working out almost daily  Progress Towards Goal(s):  In progress.  Handouts given during visit include:  Phase 3B lean protein + non starchy vegetables   Nutritional  Diagnosis:  [place]-3.3 Overweight/obesity related to past poor dietary habits and physical inactivity as evidenced by patient w/ recent sleeve gastrectomy surgery following dietary guidelines for continued weight loss.     Intervention:  Nutrition counseling provided.  Teaching Method Utilized:  Visual Auditory Hands on  Barriers to learning/adherence to lifestyle change: none  Demonstrated degree of understanding via:  Teach Back   Monitoring/Evaluation:  Dietary intake, exercise, and body weight. Follow up prn.

## 2016-11-28 NOTE — Patient Instructions (Addendum)
Goals:  Follow Phase 3B: High Protein + Non-Starchy Vegetables  Eat 3-6 small meals/snacks, every 3-5 hrs  Increase lean protein foods to meet 60g goal  Increase fluid intake to 64oz +  Avoid drinking 15 minutes before, during and 30 minutes after eating  Aim for >30 min of physical activity daily  Increase iron to 45-60 mg/day  Don't take with Calcium  TANITA  BODY COMP RESULTS  09/11/16 10/18/16 11/28/16   BMI (kg/m^2) 51.3 47.1 44.6   Fat Mass (lbs) 172.6 155.6 132   Fat Free Mass (lbs) 145.2 136.4 144.6   Total Body Water (lbs) 109.4 102 107.2

## 2016-11-29 ENCOUNTER — Encounter: Payer: Self-pay | Admitting: Dietician

## 2017-02-19 DIAGNOSIS — N390 Urinary tract infection, site not specified: Secondary | ICD-10-CM | POA: Diagnosis not present

## 2017-02-19 DIAGNOSIS — R109 Unspecified abdominal pain: Secondary | ICD-10-CM | POA: Diagnosis not present

## 2017-02-21 ENCOUNTER — Emergency Department (HOSPITAL_BASED_OUTPATIENT_CLINIC_OR_DEPARTMENT_OTHER): Payer: BLUE CROSS/BLUE SHIELD

## 2017-02-21 ENCOUNTER — Encounter (HOSPITAL_BASED_OUTPATIENT_CLINIC_OR_DEPARTMENT_OTHER): Payer: Self-pay | Admitting: Emergency Medicine

## 2017-02-21 ENCOUNTER — Emergency Department (HOSPITAL_BASED_OUTPATIENT_CLINIC_OR_DEPARTMENT_OTHER)
Admission: EM | Admit: 2017-02-21 | Discharge: 2017-02-21 | Disposition: A | Discharge status: Home / Self Care | Payer: BLUE CROSS/BLUE SHIELD | Attending: Emergency Medicine | Admitting: Emergency Medicine

## 2017-02-21 DIAGNOSIS — Z79899 Other long term (current) drug therapy: Secondary | ICD-10-CM | POA: Insufficient documentation

## 2017-02-21 DIAGNOSIS — R102 Pelvic and perineal pain: Secondary | ICD-10-CM

## 2017-02-21 DIAGNOSIS — J45909 Unspecified asthma, uncomplicated: Secondary | ICD-10-CM | POA: Insufficient documentation

## 2017-02-21 DIAGNOSIS — N83201 Unspecified ovarian cyst, right side: Secondary | ICD-10-CM | POA: Diagnosis not present

## 2017-02-21 DIAGNOSIS — E039 Hypothyroidism, unspecified: Secondary | ICD-10-CM | POA: Diagnosis not present

## 2017-02-21 DIAGNOSIS — R1031 Right lower quadrant pain: Secondary | ICD-10-CM | POA: Diagnosis present

## 2017-02-21 DIAGNOSIS — R109 Unspecified abdominal pain: Secondary | ICD-10-CM | POA: Diagnosis not present

## 2017-02-21 LAB — COMPREHENSIVE METABOLIC PANEL
ALT: 17 U/L (ref 14–54)
AST: 21 U/L (ref 15–41)
Albumin: 4 g/dL (ref 3.5–5.0)
Alkaline Phosphatase: 68 U/L (ref 38–126)
Anion gap: 12 (ref 5–15)
BUN: 13 mg/dL (ref 6–20)
CO2: 25 mmol/L (ref 22–32)
Calcium: 9.2 mg/dL (ref 8.9–10.3)
Chloride: 102 mmol/L (ref 101–111)
Creatinine, Ser: 0.98 mg/dL (ref 0.44–1.00)
GFR calc Af Amer: >60 mL/min (ref >60)
GFR calc non Af Amer: >60 mL/min (ref >60)
Glucose, Bld: 96 mg/dL (ref 65–99)
Potassium: 3.6 mmol/L (ref 3.5–5.1)
Sodium: 139 mmol/L (ref 135–145)
Total Bilirubin: 0.6 mg/dL (ref 0.3–1.2)
Total Protein: 7.4 g/dL (ref 6.5–8.1)

## 2017-02-21 LAB — WET PREP, GENITAL
Clue Cells Wet Prep HPF POC: NONE SEEN
Sperm: NONE SEEN
Trich, Wet Prep: NONE SEEN
Yeast Wet Prep HPF POC: NONE SEEN

## 2017-02-21 LAB — CBC
HCT: 35.5 % — ABNORMAL LOW (ref 36.0–46.0)
Hemoglobin: 11 g/dL — ABNORMAL LOW (ref 12.0–15.0)
MCH: 22.8 pg — ABNORMAL LOW (ref 26.0–34.0)
MCHC: 31 g/dL (ref 30.0–36.0)
MCV: 73.7 fL — ABNORMAL LOW (ref 78.0–100.0)
Platelets: 386 10*3/uL (ref 150–400)
RBC: 4.82 MIL/uL (ref 3.87–5.11)
RDW: 15.5 % (ref 11.5–15.5)
WBC: 9.3 10*3/uL (ref 4.0–10.5)

## 2017-02-21 LAB — URINALYSIS, ROUTINE W REFLEX MICROSCOPIC
Bilirubin Urine: NEGATIVE
Glucose, UA: NEGATIVE mg/dL
Hgb urine dipstick: NEGATIVE
Ketones, ur: NEGATIVE mg/dL
Leukocytes, UA: NEGATIVE
Nitrite: NEGATIVE
Protein, ur: NEGATIVE mg/dL
Specific Gravity, Urine: 1.008 (ref 1.005–1.030)
pH: 6 (ref 5.0–8.0)

## 2017-02-21 LAB — PREGNANCY, URINE: Preg Test, Ur: NEGATIVE

## 2017-02-21 MED ORDER — OXYCODONE-ACETAMINOPHEN 5-325 MG PO TABS: 1.0000 | ORAL_TABLET | Freq: Four times a day (QID) | ORAL | 0 refills | Status: DC | PRN

## 2017-02-21 MED ORDER — IOPAMIDOL (ISOVUE-300) INJECTION 61%
100.0000 mL | Freq: Once | INTRAVENOUS | Status: AC | PRN
  Administered 2017-02-21: 100 mL via INTRAVENOUS

## 2017-02-21 MED ORDER — SODIUM CHLORIDE 0.9 % IV BOLUS (SEPSIS)
1000.0000 mL | Freq: Once | INTRAVENOUS | Status: AC
  Administered 2017-02-21: 1000 mL via INTRAVENOUS

## 2017-02-21 MED ORDER — CEFTRIAXONE SODIUM 250 MG IJ SOLR
250.0000 mg | Freq: Once | INTRAMUSCULAR | Status: AC
  Administered 2017-02-21: 250 mg via INTRAMUSCULAR

## 2017-02-21 MED ORDER — CEFTRIAXONE SODIUM 250 MG IJ SOLR
INTRAMUSCULAR | Status: AC
  Filled 2017-02-21: qty 250

## 2017-02-21 MED ORDER — ONDANSETRON HCL 4 MG/2ML IJ SOLN
4.0000 mg | Freq: Once | INTRAMUSCULAR | Status: DC
  Filled 2017-02-21: qty 2

## 2017-02-21 MED ORDER — ONDANSETRON 4 MG PO TBDP: 4.0000 mg | ORAL_TABLET | Freq: Three times a day (TID) | ORAL | 0 refills | Status: DC | PRN

## 2017-02-21 MED ORDER — DOXYCYCLINE HYCLATE 100 MG PO CAPS: 100.0000 mg | ORAL_CAPSULE | Freq: Two times a day (BID) | ORAL | 0 refills | Status: DC

## 2017-02-21 MED ORDER — FENTANYL CITRATE (PF) 100 MCG/2ML IJ SOLN
50.0000 ug | Freq: Once | INTRAMUSCULAR | Status: AC
  Administered 2017-02-21: 50 ug via INTRAVENOUS
  Filled 2017-02-21: qty 2

## 2017-02-21 NOTE — ED Provider Notes (Signed)
[facility] Provider Note   CSN: [id number] Arrival date & time: 02/21/17  1727  By signing my name below, I, Nicole Reed, attest that this documentation has been prepared under the direction and in the presence of physician practitioner, Benjiman Core, MD. Electronically Signed: Linna Reed, Scribe. 02/21/2017. 6:02 PM.  History   Chief Complaint Chief Complaint  Patient presents with  . Abdominal Pain    The history is provided by the patient. No language interpreter was used.     HPI Comments: Nicole Reed is a 31 y.o. female with PMHx including GERD who presents to the Emergency Department complaining of persistent, gradually worsening RLQ pain beginning about three weeks ago. She states the pain has been radiating into her right lower back for the last few days. She notes some associated intermittent mild fevers. Pt was evaluated by her PCP two days ago for the same and had a urinalysis performed which showed an infection. She was prescribed Bactrim which she has taken as instructed. Patient reports a h/o UTI without urinary symptoms and states her current pain is somewhat similar. Patient states her menstrual periods have been normal. She has a pertinent PSHx of gastric sleeve resection and cholecystectomy. No h/o appendectomy. She denies nausea, vomiting, diarrhea, decreased appetite, dysuria, urinary frequency, difficulty urinating, hematuria, vaginal discharge, or any other associated symptoms.  Past Medical History:  Diagnosis Date  . Asthma   . GERD (gastroesophageal reflux disease)   . Headache    migraines  . Hypothyroidism     Patient Active Problem List   Diagnosis Date Noted  . 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 Morbid obesity (HCC) 10/02/2016    Past Surgical History:  Procedure Laterality Date  . CHOLECYSTECTOMY    . HIATAL HERNIA REPAIR N/A 10/02/2016   Procedure: LAPAROSCOPIC REPAIR  OF HIATAL HERNIA;  Surgeon: Luretha Murphy, MD;  Location: [facility];  Service: General;  Laterality: N/A;  . LAPAROSCOPIC GASTRIC SLEEVE RESECTION N/A 10/02/2016   Procedure: LAPAROSCOPIC GASTRIC SLEEVE RESECTION WITH UPPER ENDO;  Surgeon: Luretha Murphy, MD;  Location: [facility];  Service: General;  Laterality: N/A;    OB History    No data available       Home Medications    Prior to Admission medications   Medication Sig Start Date End Date Taking? Authorizing Provider  sulfamethoxazole-trimethoprim (BACTRIM,SEPTRA) 400-80 MG tablet Take 1 tablet by mouth 2 (two) times daily.   Yes Historical Provider, MD  albuterol (PROVENTIL HFA;VENTOLIN HFA) 108 (90 Base) MCG/ACT inhaler Inhale 2 puffs into the lungs every 6 (six) hours as needed for wheezing or shortness of breath.    Historical Provider, MD  Docusate Calcium (STOOL SOFTENER PO) Take 2 tablets by mouth at bedtime.    Historical Provider, MD  doxycycline (VIBRAMYCIN) 100 MG capsule Take 1 capsule (100 mg total) by mouth 2 (two) times daily. 02/21/17   Benjiman Core, MD  FIBER ADULT GUMMIES PO Take 2 tablets by mouth at bedtime.    Historical Provider, MD  levothyroxine (SYNTHROID, LEVOTHROID) 200 MCG tablet Take 200 mcg by mouth daily before breakfast.    Historical Provider, MD  Multiple Vitamin (MULTIVITAMIN) tablet Take 1 tablet by mouth daily. Bariatric Advantage    Historical Provider, MD  omeprazole (PRILOSEC) 20 MG capsule Take 20 mg by mouth daily.    Historical Provider, MD  ondansetron (ZOFRAN-ODT) 4 MG disintegrating tablet Take 1 tablet (4 mg total) by mouth every 8 (eight)  hours as needed for nausea or vomiting. 02/21/17   Benjiman Core, MD  oxyCODONE-acetaminophen (PERCOCET/ROXICET) 5-325 MG tablet Take 1-2 tablets by mouth every 6 (six) hours as needed for severe pain. 02/21/17   Benjiman Core, MD    Family History History reviewed. No pertinent family history.  Social History Social History  Substance Use Topics   . Smoking status: Never Smoker  . Smokeless tobacco: Never Used  . Alcohol use No     Allergies   Codeine and Guaifenesin er   Review of Systems Review of Systems  Constitutional: Positive for fever. Negative for appetite change.  HENT: Negative for congestion.   Eyes: Negative for visual disturbance.  Respiratory: Negative for shortness of breath.   Gastrointestinal: Positive for abdominal pain. Negative for diarrhea, nausea and vomiting.  Endocrine: Negative for polyuria.  Genitourinary: Negative for difficulty urinating, dysuria, frequency, hematuria and vaginal discharge.  Musculoskeletal: Positive for back pain.  Skin: Negative for wound.  Neurological: Negative for numbness.  Hematological: Negative for adenopathy.  Psychiatric/Behavioral: Negative for confusion.   Physical Exam Updated Vital Signs BP 121/67 (BP Location: Left Arm)   Pulse 68   Temp 98.6 F (37 C) (Oral)   Resp 18   Ht  (1.676 m)   Wt 240 lb (108.9 kg)   LMP 02/18/2017   SpO2 99%   BMI 38.74 kg/m   Physical Exam  Constitutional: She is oriented to person, place, and time. She appears well-developed and well-nourished. No distress.  HENT:  Head: Normocephalic and atraumatic.  Eyes: Conjunctivae and EOM are normal.  Neck: Neck supple. No tracheal deviation present.  Cardiovascular: Normal rate.   Pulmonary/Chest: Effort normal and breath sounds normal. No respiratory distress.  Lungs are CTA.  Abdominal: Soft. There is tenderness. There is no rebound, no guarding and no CVA tenderness.  Mild RLQ tenderness. No rebound or guarding.  Musculoskeletal: Normal range of motion.  No peripheral edema.  Neurological: She is alert and oriented to person, place, and time.  Skin: Skin is warm and dry.  Psychiatric: She has a normal mood and affect. Her behavior is normal.  Nursing note and vitals reviewed.  ED Treatments / Results  Labs (all labs ordered are listed, but only abnormal results  are displayed) Labs Reviewed  WET PREP, GENITAL - Abnormal; Notable for the following:       Result Value   WBC, Wet Prep HPF POC MANY (*)    All other components within normal limits  CBC - Abnormal; Notable for the following:    Hemoglobin 11.0 (*)    HCT 35.5 (*)    MCV 73.7 (*)    MCH 22.8 (*)    All other components within normal limits  URINALYSIS, ROUTINE W REFLEX MICROSCOPIC  PREGNANCY, URINE  COMPREHENSIVE METABOLIC PANEL  RPR  HIV ANTIBODY (ROUTINE TESTING)  GC/CHLAMYDIA PROBE AMP ([place]) NOT AT [facility]    EKG  EKG Interpretation None       Radiology Ct Abdomen Pelvis W Contrast  Result Date: 02/21/2017 CLINICAL DATA:  Right lower quadrant abdominal pain and flank pain over the last 2 weeks. EXAM: CT ABDOMEN AND PELVIS WITH CONTRAST TECHNIQUE: Multidetector CT imaging of the abdomen and pelvis was performed using the standard protocol following bolus administration of intravenous contrast. CONTRAST:  ISOVUE-300 IOPAMIDOL (ISOVUE-300) INJECTION 61% COMPARISON:  None. FINDINGS: Lower chest: Normal Hepatobiliary: The liver appears normal.  Previous cholecystectomy. Pancreas: Normal Spleen: Normal Adrenals/Urinary Tract: Adrenal glands are normal. Kidneys  are normal. No cyst, mass, stone or hydronephrosis. Bladder is normal. Stomach/Bowel: Normal appearing appendix. No other abnormal bowel finding. Vascular/Lymphatic: Normal Reproductive: Free fluid in the pelvic cul de sac. Complex right adnexal lesion presumed ovarian with solid and cystic components measuring approximately 5 cm in diameter. This could be due to pelvic inflammatory disease or functional ovarian cystic disease. Tumor not excluded. Other: No free air. Musculoskeletal: Ordinary lower lumbar degenerative changes. IMPRESSION: Right pelvic abnormality, presumed ovarian, probably representing pelvic inflammatory disease without 5 cm cystic and solid area that could represent tubo-ovarian abscess. Differential  diagnosis includes functional cyst and tumor. There is some fluid in the pelvic cul de sac. Electronically Signed   By: Paulina Fusi M.D.   On: 02/21/2017 22:28    Procedures Procedures (including critical care time)  DIAGNOSTIC STUDIES: Oxygen Saturation is 100% on RA, normal by my interpretation.    COORDINATION OF CARE: 6:08 PM Discussed treatment plan with pt at bedside and pt agreed to plan.  Medications Ordered in ED Medications  ondansetron (ZOFRAN) injection 4 mg (4 mg Intravenous Refused 02/21/17 1837)  cefTRIAXone (ROCEPHIN) 250 MG injection (not administered)  sodium chloride 0.9 % bolus 1,000 mL (0 mLs Intravenous Stopped 02/21/17 2100)  fentaNYL (SUBLIMAZE) injection 50 mcg (50 mcg Intravenous Given 02/21/17 1921)  iopamidol (ISOVUE-300) 61 % injection 100 mL (100 mLs Intravenous Contrast Given 02/21/17 2216)  cefTRIAXone (ROCEPHIN) injection 250 mg (250 mg Intramuscular Given 02/21/17 2349)     Initial Impression / Assessment and Plan / ED Course  I have reviewed the triage vital signs and the nursing notes.  Pertinent labs & imaging results that were available during my care of the patient were reviewed by me and considered in my medical decision making (see chart for details).     Patient with right lower quadrant pain. Overall benign abdominal exam. Does have some green pelvic discharge but no cervical motion tenderness. No adnexal tenderness. No systemic white count. CT scan shows adnexal mass. Discussed with radiology. No need for ultrasound at this time. Discussed with patient and will empirically treat for PID or tubo-ovarian abscess. I think cyst is more likely however will treat for tubo-ovarian abscess/PID. Follow-up with [facility].  Final  Clinical Impressions(s) / ED Diagnoses   Final diagnoses:  Pelvic pain  Cyst of right ovary    New Prescriptions Discharge Medication List as of 02/21/2017 11:46 PM    START taking these medications   Details  doxycycline  (VIBRAMYCIN) 100 MG capsule Take 1 capsule (100 mg total) by mouth 2 (two) times daily., Starting Wed 02/21/2017, Print    ondansetron (ZOFRAN-ODT) 4 MG disintegrating tablet Take 1 tablet (4 mg total) by mouth every 8 (eight) hours as needed for nausea or vomiting., Starting Wed 02/21/2017, Print    oxyCODONE-acetaminophen (PERCOCET/ROXICET) 5-325 MG tablet Take 1-2 tablets by mouth every 6 (six) hours as needed for severe pain., Starting Wed 02/21/2017, Print       I personally performed the services described in this documentation, which was scribed in my presence. The recorded information has been reviewed and is accurate.      Benjiman Core, MD 02/22/17 0030

## 2017-02-21 NOTE — Discharge Instructions (Signed)
Follow with Dr Billy Coast. Watch for fevers or increasing pain.

## 2017-02-21 NOTE — ED Notes (Signed)
Patient transported to CT 

## 2017-02-21 NOTE — ED Triage Notes (Signed)
Patient states that she is having pain to her right lower quadrant and right flank x 1 week. Went to her PMD and was told to come here because her urine culture was not positive enough to be just a uTI causing this pain

## 2017-02-22 DIAGNOSIS — N83201 Unspecified ovarian cyst, right side: Secondary | ICD-10-CM | POA: Diagnosis not present

## 2017-02-22 DIAGNOSIS — R1031 Right lower quadrant pain: Secondary | ICD-10-CM | POA: Diagnosis not present

## 2017-02-22 LAB — GC/CHLAMYDIA PROBE AMP (~~LOC~~) NOT AT ARMC
Chlamydia: NEGATIVE
Neisseria Gonorrhea: NEGATIVE

## 2017-02-22 LAB — RPR: RPR Ser Ql: NONREACTIVE

## 2017-02-22 LAB — HIV ANTIBODY (ROUTINE TESTING W REFLEX): HIV Screen 4th Generation wRfx: NONREACTIVE

## 2017-03-01 DIAGNOSIS — R1031 Right lower quadrant pain: Secondary | ICD-10-CM | POA: Diagnosis not present

## 2017-03-01 DIAGNOSIS — N83201 Unspecified ovarian cyst, right side: Secondary | ICD-10-CM | POA: Diagnosis not present

## 2017-03-29 DIAGNOSIS — Z9884 Bariatric surgery status: Secondary | ICD-10-CM | POA: Diagnosis not present

## 2017-04-09 DIAGNOSIS — N83291 Other ovarian cyst, right side: Secondary | ICD-10-CM | POA: Diagnosis not present

## 2017-04-09 DIAGNOSIS — E039 Hypothyroidism, unspecified: Secondary | ICD-10-CM | POA: Diagnosis not present

## 2017-05-01 DIAGNOSIS — N930 Postcoital and contact bleeding: Secondary | ICD-10-CM | POA: Diagnosis not present

## 2017-05-23 DIAGNOSIS — N930 Postcoital and contact bleeding: Secondary | ICD-10-CM | POA: Diagnosis not present

## 2017-05-23 DIAGNOSIS — N979 Female infertility, unspecified: Secondary | ICD-10-CM | POA: Diagnosis not present

## 2017-06-01 DIAGNOSIS — K2900 Acute gastritis without bleeding: Secondary | ICD-10-CM | POA: Diagnosis not present

## 2017-06-01 DIAGNOSIS — R072 Precordial pain: Secondary | ICD-10-CM | POA: Diagnosis not present

## 2017-06-01 DIAGNOSIS — R61 Generalized hyperhidrosis: Secondary | ICD-10-CM | POA: Diagnosis not present

## 2017-06-01 DIAGNOSIS — R748 Abnormal levels of other serum enzymes: Secondary | ICD-10-CM | POA: Diagnosis not present

## 2017-06-01 DIAGNOSIS — R079 Chest pain, unspecified: Secondary | ICD-10-CM | POA: Diagnosis not present

## 2017-06-01 DIAGNOSIS — R42 Dizziness and giddiness: Secondary | ICD-10-CM | POA: Diagnosis not present

## 2017-06-01 DIAGNOSIS — R0602 Shortness of breath: Secondary | ICD-10-CM | POA: Diagnosis not present

## 2017-06-01 DIAGNOSIS — K219 Gastro-esophageal reflux disease without esophagitis: Secondary | ICD-10-CM | POA: Diagnosis not present

## 2017-06-01 DIAGNOSIS — R110 Nausea: Secondary | ICD-10-CM | POA: Diagnosis not present

## 2017-06-01 DIAGNOSIS — D72829 Elevated white blood cell count, unspecified: Secondary | ICD-10-CM | POA: Diagnosis not present

## 2017-06-01 DIAGNOSIS — R1013 Epigastric pain: Secondary | ICD-10-CM | POA: Diagnosis not present

## 2017-06-02 DIAGNOSIS — K2900 Acute gastritis without bleeding: Secondary | ICD-10-CM | POA: Diagnosis not present

## 2017-06-02 DIAGNOSIS — R079 Chest pain, unspecified: Secondary | ICD-10-CM | POA: Diagnosis not present

## 2017-06-02 DIAGNOSIS — R1013 Epigastric pain: Secondary | ICD-10-CM | POA: Diagnosis not present

## 2017-06-07 ENCOUNTER — Encounter (HOSPITAL_COMMUNITY): Admission: RE | Payer: Self-pay | Source: Ambulatory Visit

## 2017-06-07 ENCOUNTER — Ambulatory Visit (HOSPITAL_COMMUNITY)
Admission: RE | Admit: 2017-06-07 | Payer: BLUE CROSS/BLUE SHIELD | Source: Ambulatory Visit | Admitting: Obstetrics and Gynecology

## 2017-06-07 SURGERY — ROBOTIC ASSISTED LAPAROSCOPIC OVARIAN CYSTECTOMY
Anesthesia: General | Laterality: Right

## 2017-06-13 DIAGNOSIS — R1013 Epigastric pain: Secondary | ICD-10-CM | POA: Diagnosis not present

## 2017-06-19 DIAGNOSIS — F4323 Adjustment disorder with mixed anxiety and depressed mood: Secondary | ICD-10-CM | POA: Diagnosis not present

## 2017-07-10 DIAGNOSIS — F4323 Adjustment disorder with mixed anxiety and depressed mood: Secondary | ICD-10-CM | POA: Diagnosis not present

## 2017-07-11 DIAGNOSIS — Z3141 Encounter for fertility testing: Secondary | ICD-10-CM | POA: Diagnosis not present

## 2017-08-14 DIAGNOSIS — R1031 Right lower quadrant pain: Secondary | ICD-10-CM | POA: Diagnosis not present

## 2017-08-20 DIAGNOSIS — S060X9A Concussion with loss of consciousness of unspecified duration, initial encounter: Secondary | ICD-10-CM | POA: Diagnosis not present

## 2017-08-27 DIAGNOSIS — Z3169 Encounter for other general counseling and advice on procreation: Secondary | ICD-10-CM | POA: Diagnosis not present

## 2017-09-24 ENCOUNTER — Other Ambulatory Visit: Payer: Self-pay | Admitting: Obstetrics and Gynecology

## 2017-09-24 NOTE — Patient Instructions (Addendum)
Your procedure is scheduled on:   Thursday, Dec 6  [facility]:  8:45 am  Pick up the phone at the desk and dial [phone].  Call this number if you have problems the morning of surgery: [phone].  Remember: Do NOT eat food or drink clear liquids (including water) after Midnight Wednesday  Take these medicines the morning of surgery with a SIP OF WATER:  Synthroid and omeprazole  Bring your albuterol inhaler with you on day of surgery.  Do Not smoke on the day of surgery.  Stop herbal medications and supplements at this time.  Do NOT wear jewelry (body piercing), metal hair clips/bobby pins, make-up, or nail polish. Do NOT wear lotions, powders, or perfumes.  You may wear deoderant. Do NOT shave for 48 hours prior to surgery. Do NOT bring valuables to the hospital. Contacts, dentures, or bridgework may not be worn into surgery.  Leave suitcase in car.  After surgery it may be brought to your room.  For patients admitted to the hospital, checkout time is 11:00 AM the day of discharge. Have a responsible adult drive you home and stay with you for 24 hours after your procedure.

## 2017-09-26 DIAGNOSIS — N6320 Unspecified lump in the left breast, unspecified quadrant: Secondary | ICD-10-CM | POA: Diagnosis not present

## 2017-10-01 ENCOUNTER — Encounter (HOSPITAL_COMMUNITY)
Admission: RE | Admit: 2017-10-01 | Discharge: 2017-10-01 | Disposition: A | Discharge status: Home / Self Care | Payer: BLUE CROSS/BLUE SHIELD | Source: Ambulatory Visit | Attending: Obstetrics and Gynecology | Admitting: Obstetrics and Gynecology

## 2017-10-02 ENCOUNTER — Encounter (HOSPITAL_COMMUNITY): Payer: Self-pay

## 2017-10-02 ENCOUNTER — Other Ambulatory Visit: Payer: Self-pay

## 2017-10-02 ENCOUNTER — Encounter (HOSPITAL_COMMUNITY)
Admission: RE | Admit: 2017-10-02 | Discharge: 2017-10-02 | Disposition: A | Discharge status: Home / Self Care | Payer: BLUE CROSS/BLUE SHIELD | Source: Ambulatory Visit | Attending: Obstetrics and Gynecology | Admitting: Obstetrics and Gynecology

## 2017-10-02 DIAGNOSIS — N83201 Unspecified ovarian cyst, right side: Secondary | ICD-10-CM | POA: Diagnosis not present

## 2017-10-02 DIAGNOSIS — Z01812 Encounter for preprocedural laboratory examination: Secondary | ICD-10-CM | POA: Insufficient documentation

## 2017-10-02 HISTORY — DX: Personal history of other diseases of the digestive system: Z87.19

## 2017-10-02 HISTORY — DX: Other specified postprocedural states: R11.2

## 2017-10-02 HISTORY — DX: Dizziness and giddiness: R42

## 2017-10-02 HISTORY — DX: Other specified postprocedural states: Z98.890

## 2017-10-02 HISTORY — DX: Concussion with loss of consciousness of unspecified duration, initial encounter: S06.0X9A

## 2017-10-02 LAB — CBC
HCT: 39.8 % (ref 36.0–46.0)
Hemoglobin: 12.1 g/dL (ref 12.0–15.0)
MCH: 24.2 pg — ABNORMAL LOW (ref 26.0–34.0)
MCHC: 30.4 g/dL (ref 30.0–36.0)
MCV: 79.8 fL (ref 78.0–100.0)
Platelets: 363 10*3/uL (ref 150–400)
RBC: 4.99 MIL/uL (ref 3.87–5.11)
RDW: 14.9 % (ref 11.5–15.5)
WBC: 9.7 10*3/uL (ref 4.0–10.5)

## 2017-10-02 LAB — BASIC METABOLIC PANEL
Anion gap: 10 (ref 5–15)
BUN: 17 mg/dL (ref 6–20)
CO2: 26 mmol/L (ref 22–32)
Calcium: 9.1 mg/dL (ref 8.9–10.3)
Chloride: 106 mmol/L (ref 101–111)
Creatinine, Ser: 0.81 mg/dL (ref 0.44–1.00)
GFR calc Af Amer: >60 mL/min (ref >60)
GFR calc non Af Amer: >60 mL/min (ref >60)
Glucose, Bld: 98 mg/dL (ref 65–99)
Potassium: 4.7 mmol/L (ref 3.5–5.1)
Sodium: 142 mmol/L (ref 135–145)

## 2017-10-02 NOTE — Patient Instructions (Addendum)
Your procedure is scheduled on:  Thursday, Dec. 6, 2018  [facility]:  8:45 AM  Pick up the phone at the desk and dial [phone].  Call this number if you have problems the morning of surgery: [phone].  Remember: Do NOT eat food or drink after:  Midnight Wednesday  Take these medicines the morning of surgery with a SIP OF WATER:  Levothyroxine  Bring Asthma Inhaler day of surgery  Stop ALL herbal medications at this time  Do NOT smoke the day of surgery.  Do NOT wear jewelry (body piercing), metal hair clips/bobby pins, make-up, artifical eyelashes or nail polish. Do NOT wear lotions, powders, or perfumes.  You may wear deodorant. Do NOT shave for 48 hours prior to surgery. Do NOT bring valuables to the hospital. Contacts, dentures, or bridgework may not be worn into surgery.  Have a responsible adult drive you home and stay with you for 24 hours after your procedure  Bring a copy of your healthcare power of attorney and living will documents.  [place] - Preparing for Surgery Before surgery, you can play an important role.  Because skin is not sterile, your skin needs to be as free of germs as possible.  You can reduce the number of germs on your skin by washing with CHG (chlorahexidine gluconate) soap before surgery.  CHG is an antiseptic cleaner which kills germs and bonds with the skin to continue killing germs even after washing. Please DO NOT use if you have an allergy to CHG or antibacterial soaps.  If your skin becomes reddened/irritated stop using the CHG and inform your nurse when you arrive at Short Stay. Do not shave (including legs and underarms) for at least 48 hours prior to the first CHG shower.  You may shave your face/neck.  Please follow these instructions carefully:  1.  Shower with CHG Soap the night before surgery and the  morning of surgery.  2.  If you choose to wash your hair, wash your hair first as usual  with your normal  shampoo.  3.  After you shampoo, rinse your hair and body thoroughly to remove the shampoo.                             4.  Use CHG as you would any other liquid soap.  You can apply chg directly to the skin and wash.  Gently with a scrungie or clean washcloth.  5.  Apply the CHG Soap to your body ONLY FROM THE NECK DOWN.   Do   not use on face/ open                           Wound or open sores. Avoid contact with eyes, ears mouth and   genitals (private parts).                       Wash face,  Genitals (private parts) with your normal soap.             6.  Wash thoroughly, paying special attention to the area where your    surgery  will be performed.  7.  Thoroughly rinse your body with warm water from the neck down.  8.  DO NOT shower/wash with your normal soap after using and rinsing off the CHG Soap.  9.  Pat yourself dry with a clean towel.            10.  Wear clean pajamas.            11.  Place clean sheets on your bed the night of your first shower and do not  sleep with pets. Day of Surgery : Do not apply any lotions/deodorants the morning of surgery.  Please wear clean clothes to the hospital/surgery center.  FAILURE TO FOLLOW THESE INSTRUCTIONS MAY RESULT IN THE CANCELLATION OF YOUR SURGERY  PATIENT SIGNATURE_________________________________  NURSE SIGNATURE__________________________________  ________________________________________________________________________

## 2017-10-03 ENCOUNTER — Other Ambulatory Visit: Payer: Self-pay | Admitting: Obstetrics and Gynecology

## 2017-10-03 DIAGNOSIS — N6320 Unspecified lump in the left breast, unspecified quadrant: Secondary | ICD-10-CM

## 2017-10-08 ENCOUNTER — Ambulatory Visit
Admission: RE | Admit: 2017-10-08 | Discharge: 2017-10-08 | Disposition: A | Discharge status: Home / Self Care | Payer: BLUE CROSS/BLUE SHIELD | Source: Ambulatory Visit | Attending: Obstetrics and Gynecology | Admitting: Obstetrics and Gynecology

## 2017-10-08 ENCOUNTER — Other Ambulatory Visit: Payer: Self-pay | Admitting: Obstetrics and Gynecology

## 2017-10-08 DIAGNOSIS — N6310 Unspecified lump in the right breast, unspecified quadrant: Secondary | ICD-10-CM

## 2017-10-08 DIAGNOSIS — N6489 Other specified disorders of breast: Secondary | ICD-10-CM | POA: Diagnosis not present

## 2017-10-08 DIAGNOSIS — N6320 Unspecified lump in the left breast, unspecified quadrant: Secondary | ICD-10-CM

## 2017-10-08 DIAGNOSIS — R922 Inconclusive mammogram: Secondary | ICD-10-CM | POA: Diagnosis not present

## 2017-10-08 DIAGNOSIS — D241 Benign neoplasm of right breast: Secondary | ICD-10-CM | POA: Diagnosis not present

## 2017-10-08 DIAGNOSIS — N6311 Unspecified lump in the right breast, upper outer quadrant: Secondary | ICD-10-CM | POA: Diagnosis not present

## 2017-10-10 NOTE — H&P (Signed)
NAMThurnell Reed:  Nicole, Reed              ACCOUNT NO.:  [id number]  MEDICAL RECORD NO.:  [id number]  LOCATION:                                 FACILITY:  PHYSICIAN:  Lenoard Adenichard J. Kierre Deines, M.D.     DATE OF BIRTH:  DATE OF ADMISSION: DATE OF DISCHARGE:                             HISTORY & PHYSICAL   INDICATION FOR SURGERY:  Complex right ovarian cyst, questionable endometrioma versus hemorrhagic cyst.  HISTORY OF PRESENT ILLNESS:  A 31 year old white female G0, P0, history of sleeve gastrectomy, history of cholecystectomy in 2005, presents now with right adnexal mass, symptomatic hemorrhagic versus endometrioma versus dermoid-appearing cyst, for surgical intervention.  MEDICATIONS:  Thyroid supplementation, iron, vitamins, and calcium.  ALLERGIES:  CODEINE.  FAMILY HISTORY:  Myocardial infarction, diabetes, hypertension, breast cancer.  SOCIAL HISTORY:  A nonsmoker, nondrinker.  Denies domestic or physical violence.  SURGICAL HISTORY:  Sleeve gastrectomy, November 2017; cholecystectomy in 2005.  PHYSICAL EXAMINATION:  GENERAL:  She is a well-developed, well-nourished white female, in no acute distress. HEENT:  Normal. NECK:  Supple.  Full range of motion. LUNGS:  Clear. HEART:  Regular rate and rhythm. ABDOMEN:  Soft, nontender. PELVIC:  Anteflexed uterus.  No adnexal masses are appreciated.  Pain in the right adnexa. EXTREMITIES:  There are no cords. NEUROLOGIC:  Nonfocal. SKIN:  Intact.  IMPRESSION:  Complex, hemorrhagic, symptomatic right adnexal cyst.  PLAN:  Proceed with da Vinci assisted right ovarian cystectomy, possible right salpingo-oophorectomy, possible ablation excision of endometriosis.  Risks of surgery to include infection, bleeding, injury to surrounding organs, and possible need for repair discussed.  Delayed versus immediate complications too are described.  The patient acknowledges, consent signed.  The patient wishes to proceed.     Lenoard Adenichard J.  Auguste Tebbetts, M.D.     RJT/MEDQ  D:  10/10/2017  T:  10/10/2017  Job:  [id number]  cc:   [facility]

## 2017-10-11 ENCOUNTER — Ambulatory Visit (HOSPITAL_COMMUNITY): Payer: BLUE CROSS/BLUE SHIELD | Admitting: Anesthesiology

## 2017-10-11 ENCOUNTER — Encounter (HOSPITAL_COMMUNITY)
Admission: AD | Disposition: A | Discharge status: Home / Self Care | Payer: Self-pay | Source: Ambulatory Visit | Attending: Obstetrics and Gynecology

## 2017-10-11 ENCOUNTER — Encounter (HOSPITAL_COMMUNITY): Payer: Self-pay | Admitting: Emergency Medicine

## 2017-10-11 ENCOUNTER — Ambulatory Visit (HOSPITAL_COMMUNITY)
Admission: AD | Admit: 2017-10-11 | Discharge: 2017-10-11 | Disposition: A | Discharge status: Home / Self Care | Payer: BLUE CROSS/BLUE SHIELD | Source: Ambulatory Visit | Attending: Obstetrics and Gynecology | Admitting: Obstetrics and Gynecology

## 2017-10-11 DIAGNOSIS — N803 Endometriosis of pelvic peritoneum: Secondary | ICD-10-CM | POA: Insufficient documentation

## 2017-10-11 DIAGNOSIS — D259 Leiomyoma of uterus, unspecified: Secondary | ICD-10-CM | POA: Diagnosis not present

## 2017-10-11 DIAGNOSIS — Z79899 Other long term (current) drug therapy: Secondary | ICD-10-CM | POA: Diagnosis not present

## 2017-10-11 DIAGNOSIS — N83291 Other ovarian cyst, right side: Secondary | ICD-10-CM | POA: Diagnosis not present

## 2017-10-11 DIAGNOSIS — N736 Female pelvic peritoneal adhesions (postinfective): Secondary | ICD-10-CM | POA: Insufficient documentation

## 2017-10-11 DIAGNOSIS — K219 Gastro-esophageal reflux disease without esophagitis: Secondary | ICD-10-CM | POA: Insufficient documentation

## 2017-10-11 DIAGNOSIS — E039 Hypothyroidism, unspecified: Secondary | ICD-10-CM | POA: Insufficient documentation

## 2017-10-11 DIAGNOSIS — N83201 Unspecified ovarian cyst, right side: Secondary | ICD-10-CM | POA: Insufficient documentation

## 2017-10-11 DIAGNOSIS — N809 Endometriosis, unspecified: Secondary | ICD-10-CM | POA: Diagnosis not present

## 2017-10-11 HISTORY — PX: ROBOTIC ASSISTED LAPAROSCOPIC LYSIS OF ADHESION: SHX6080

## 2017-10-11 HISTORY — PX: ROBOTIC ASSISTED LAPAROSCOPIC OVARIAN CYSTECTOMY: SHX6081

## 2017-10-11 LAB — HCG, SERUM, QUALITATIVE: Preg, Serum: NEGATIVE

## 2017-10-11 SURGERY — ROBOTIC ASSISTED LAPAROSCOPIC OVARIAN CYSTECTOMY
Anesthesia: General | Laterality: Right

## 2017-10-11 MED ORDER — GLYCOPYRROLATE 0.2 MG/ML IJ SOLN
INTRAMUSCULAR | Status: AC
  Filled 2017-10-11: qty 1

## 2017-10-11 MED ORDER — SODIUM CHLORIDE 0.9 % IR SOLN
Status: DC | PRN
  Administered 2017-10-11: 3000 mL

## 2017-10-11 MED ORDER — SUGAMMADEX SODIUM 200 MG/2ML IV SOLN
INTRAVENOUS | Status: DC | PRN
  Administered 2017-10-11: 211.8 mg via INTRAVENOUS

## 2017-10-11 MED ORDER — MEPERIDINE HCL 25 MG/ML IJ SOLN: 6.2500 mg | INTRAMUSCULAR | Status: DC | PRN

## 2017-10-11 MED ORDER — PROPOFOL 10 MG/ML IV BOLUS
INTRAVENOUS | Status: DC | PRN
  Administered 2017-10-11: 150 mg via INTRAVENOUS

## 2017-10-11 MED ORDER — BUPIVACAINE HCL (PF) 0.25 % IJ SOLN
INTRAMUSCULAR | Status: DC | PRN
  Administered 2017-10-11: 30 mL

## 2017-10-11 MED ORDER — GLYCOPYRROLATE 0.2 MG/ML IJ SOLN
INTRAMUSCULAR | Status: DC | PRN
  Administered 2017-10-11 (×2): 0.1 mg via INTRAVENOUS

## 2017-10-11 MED ORDER — SCOPOLAMINE 1 MG/3DAYS TD PT72
MEDICATED_PATCH | TRANSDERMAL | Status: AC
  Administered 2017-10-11: 1.5 mg via TRANSDERMAL
  Filled 2017-10-11: qty 1

## 2017-10-11 MED ORDER — DEXAMETHASONE SODIUM PHOSPHATE 10 MG/ML IJ SOLN
INTRAMUSCULAR | Status: DC | PRN
  Administered 2017-10-11: 4 mg via INTRAVENOUS

## 2017-10-11 MED ORDER — FENTANYL CITRATE (PF) 100 MCG/2ML IJ SOLN
INTRAMUSCULAR | Status: AC
  Filled 2017-10-11: qty 2

## 2017-10-11 MED ORDER — ONDANSETRON HCL 4 MG/2ML IJ SOLN
INTRAMUSCULAR | Status: DC | PRN
  Administered 2017-10-11: 4 mg via INTRAVENOUS

## 2017-10-11 MED ORDER — ONDANSETRON HCL 4 MG/2ML IJ SOLN
INTRAMUSCULAR | Status: AC
  Filled 2017-10-11: qty 2

## 2017-10-11 MED ORDER — LIDOCAINE HCL (CARDIAC) 20 MG/ML IV SOLN
INTRAVENOUS | Status: AC
  Filled 2017-10-11: qty 5

## 2017-10-11 MED ORDER — CEFAZOLIN SODIUM-DEXTROSE 2-4 GM/100ML-% IV SOLN
2.0000 g | INTRAVENOUS | Status: AC
  Administered 2017-10-11: 2 g via INTRAVENOUS
  Filled 2017-10-11: qty 100

## 2017-10-11 MED ORDER — SODIUM CHLORIDE 0.9 % IJ SOLN
INTRAMUSCULAR | Status: AC
  Filled 2017-10-11: qty 50

## 2017-10-11 MED ORDER — SCOPOLAMINE 1 MG/3DAYS TD PT72
1.0000 | MEDICATED_PATCH | Freq: Once | TRANSDERMAL | Status: DC
  Administered 2017-10-11: 1.5 mg via TRANSDERMAL

## 2017-10-11 MED ORDER — ONDANSETRON HCL 4 MG/2ML IJ SOLN
4.0000 mg | Freq: Once | INTRAMUSCULAR | Status: AC | PRN
  Administered 2017-10-11: 4 mg via INTRAVENOUS

## 2017-10-11 MED ORDER — OXYCODONE-ACETAMINOPHEN 5-325 MG PO TABS: 1.0000 | ORAL_TABLET | ORAL | 0 refills | Status: DC | PRN

## 2017-10-11 MED ORDER — CEFAZOLIN SODIUM-DEXTROSE 2-3 GM-%(50ML) IV SOLR
INTRAVENOUS | Status: AC
  Filled 2017-10-11: qty 50

## 2017-10-11 MED ORDER — SODIUM CHLORIDE 0.9 % IV SOLN
INTRAVENOUS | Status: DC | PRN
  Administered 2017-10-11: 60 mL

## 2017-10-11 MED ORDER — DEXAMETHASONE SODIUM PHOSPHATE 4 MG/ML IJ SOLN
INTRAMUSCULAR | Status: AC
  Filled 2017-10-11: qty 1

## 2017-10-11 MED ORDER — MIDAZOLAM HCL 2 MG/2ML IJ SOLN
INTRAMUSCULAR | Status: DC | PRN
  Administered 2017-10-11: 2 mg via INTRAVENOUS

## 2017-10-11 MED ORDER — KETOROLAC TROMETHAMINE 30 MG/ML IJ SOLN
INTRAMUSCULAR | Status: AC
  Filled 2017-10-11: qty 1

## 2017-10-11 MED ORDER — 0.9 % SODIUM CHLORIDE (POUR BTL) OPTIME
TOPICAL | Status: DC | PRN
  Administered 2017-10-11: 1000 mL

## 2017-10-11 MED ORDER — LIDOCAINE HCL (CARDIAC) 20 MG/ML IV SOLN
INTRAVENOUS | Status: DC | PRN
  Administered 2017-10-11: 80 mg via INTRAVENOUS

## 2017-10-11 MED ORDER — BUPIVACAINE HCL (PF) 0.25 % IJ SOLN
INTRAMUSCULAR | Status: AC
  Filled 2017-10-11: qty 30

## 2017-10-11 MED ORDER — FENTANYL CITRATE (PF) 100 MCG/2ML IJ SOLN
25.0000 ug | INTRAMUSCULAR | Status: DC | PRN
  Administered 2017-10-11: 50 ug via INTRAVENOUS
  Administered 2017-10-11 (×2): 25 ug via INTRAVENOUS

## 2017-10-11 MED ORDER — ARTIFICIAL TEARS OPHTHALMIC OINT
TOPICAL_OINTMENT | OPHTHALMIC | Status: AC
  Filled 2017-10-11: qty 3.5

## 2017-10-11 MED ORDER — ROCURONIUM BROMIDE 100 MG/10ML IV SOLN
INTRAVENOUS | Status: AC
  Filled 2017-10-11: qty 1

## 2017-10-11 MED ORDER — ROCURONIUM BROMIDE 100 MG/10ML IV SOLN
INTRAVENOUS | Status: DC | PRN
  Administered 2017-10-11: 50 mg via INTRAVENOUS
  Administered 2017-10-11: 20 mg via INTRAVENOUS

## 2017-10-11 MED ORDER — LACTATED RINGERS IV SOLN
INTRAVENOUS | Status: DC
  Administered 2017-10-11 (×2): via INTRAVENOUS

## 2017-10-11 MED ORDER — PROPOFOL 10 MG/ML IV BOLUS
INTRAVENOUS | Status: AC
  Filled 2017-10-11: qty 20

## 2017-10-11 MED ORDER — FENTANYL CITRATE (PF) 100 MCG/2ML IJ SOLN
INTRAMUSCULAR | Status: DC | PRN
  Administered 2017-10-11 (×3): 50 ug via INTRAVENOUS
  Administered 2017-10-11: 100 ug via INTRAVENOUS

## 2017-10-11 MED ORDER — MIDAZOLAM HCL 2 MG/2ML IJ SOLN
INTRAMUSCULAR | Status: AC
  Filled 2017-10-11: qty 2

## 2017-10-11 MED ORDER — SUGAMMADEX SODIUM 200 MG/2ML IV SOLN
INTRAVENOUS | Status: AC
  Filled 2017-10-11: qty 2

## 2017-10-11 MED ORDER — FENTANYL CITRATE (PF) 250 MCG/5ML IJ SOLN
INTRAMUSCULAR | Status: AC
  Filled 2017-10-11: qty 5

## 2017-10-11 MED ORDER — ROPIVACAINE HCL 5 MG/ML IJ SOLN
INTRAMUSCULAR | Status: AC
  Filled 2017-10-11: qty 30

## 2017-10-11 SURGICAL SUPPLY — 50 items
ADH SKN CLS APL DERMABOND .7 (GAUZE/BANDAGES/DRESSINGS) ×2
BARRIER ADHS 3X4 INTERCEED (GAUZE/BANDAGES/DRESSINGS) ×1 IMPLANT
BRR ADH 4X3 ABS CNTRL BYND (GAUZE/BANDAGES/DRESSINGS) ×2
CONT PATH 16OZ SNAP LID 3702 (MISCELLANEOUS) ×3 IMPLANT
COVER BACK TABLE 60X90IN (DRAPES) ×6 IMPLANT
COVER TIP SHEARS 8 DVNC (MISCELLANEOUS) ×2 IMPLANT
COVER TIP SHEARS 8MM DA VINCI (MISCELLANEOUS) ×1
DECANTER SPIKE VIAL GLASS SM (MISCELLANEOUS) ×9 IMPLANT
DEFOGGER SCOPE WARMER CLEARIFY (MISCELLANEOUS) ×3 IMPLANT
DERMABOND ADVANCED (GAUZE/BANDAGES/DRESSINGS) ×1
DERMABOND ADVANCED .7 DNX12 (GAUZE/BANDAGES/DRESSINGS) ×2 IMPLANT
DRSG OPSITE POSTOP 3X4 (GAUZE/BANDAGES/DRESSINGS) ×3 IMPLANT
DURAPREP 26ML APPLICATOR (WOUND CARE) ×3 IMPLANT
ELECT REM PT RETURN 9FT ADLT (ELECTROSURGICAL) ×3
ELECTRODE REM PT RTRN 9FT ADLT (ELECTROSURGICAL) ×2 IMPLANT
GAUZE VASELINE 3X9 (GAUZE/BANDAGES/DRESSINGS) ×1 IMPLANT
GLOVE BIO SURGEON STRL SZ7.5 (GLOVE) ×9 IMPLANT
GLOVE BIOGEL PI IND STRL 7.0 (GLOVE) ×4 IMPLANT
GLOVE BIOGEL PI INDICATOR 7.0 (GLOVE) ×2
KIT ACCESSORY DA VINCI DISP (KITS) ×1
KIT ACCESSORY DVNC DISP (KITS) ×2 IMPLANT
LEGGING LITHOTOMY PAIR STRL (DRAPES) ×2 IMPLANT
NEEDLE INSUFFLATION 150MM (ENDOMECHANICALS) ×3 IMPLANT
PACK ROBOT WH (CUSTOM PROCEDURE TRAY) ×3 IMPLANT
PACK ROBOTIC GOWN (GOWN DISPOSABLE) ×3 IMPLANT
PACK TRENDGUARD 450 HYBRID PRO (MISCELLANEOUS) IMPLANT
PACK TRENDGUARD 600 HYBRD PROC (MISCELLANEOUS) IMPLANT
PAD PREP 24X48 CUFFED NSTRL (MISCELLANEOUS) ×3 IMPLANT
PROTECTOR NERVE ULNAR (MISCELLANEOUS) ×6 IMPLANT
SET CYSTO W/LG BORE CLAMP LF (SET/KITS/TRAYS/PACK) IMPLANT
SET IRRIG TUBING LAPAROSCOPIC (IRRIGATION / IRRIGATOR) ×3 IMPLANT
SET TRI-LUMEN FLTR TB AIRSEAL (TUBING) ×3 IMPLANT
SUT VIC AB 3-0 SH 27 (SUTURE)
SUT VIC AB 3-0 SH 27X BRD (SUTURE) IMPLANT
SUT VICRYL 0 UR6 27IN ABS (SUTURE) ×3 IMPLANT
SUT VICRYL RAPIDE 4/0 PS 2 (SUTURE) ×6 IMPLANT
SUT VLOC 180 3-0 9IN GS21 (SUTURE) IMPLANT
TIP UTERINE 5.1X6CM LAV DISP (MISCELLANEOUS) IMPLANT
TIP UTERINE 6.7X10CM GRN DISP (MISCELLANEOUS) IMPLANT
TIP UTERINE 6.7X6CM WHT DISP (MISCELLANEOUS) IMPLANT
TIP UTERINE 6.7X8CM BLUE DISP (MISCELLANEOUS) IMPLANT
TOWEL OR 17X24 6PK STRL BLUE (TOWEL DISPOSABLE) ×9 IMPLANT
TRENDGUARD 450 HYBRID PRO PACK (MISCELLANEOUS) ×3
TRENDGUARD 600 HYBRID PROC PK (MISCELLANEOUS)
TROCAR BALLN GELPORT 12X130M (ENDOMECHANICALS) ×1 IMPLANT
TROCAR DISP BLADELESS 8 DVNC (TROCAR) ×2 IMPLANT
TROCAR DISP BLADELESS 8MM (TROCAR) ×1
TROCAR PORT AIRSEAL 5X120 (TROCAR) ×3 IMPLANT
TROCAR Z-THREAD 12X150 (TROCAR) ×3 IMPLANT
WATER STERILE IRR 1000ML POUR (IV SOLUTION) ×3 IMPLANT

## 2017-10-11 NOTE — Transfer of Care (Signed)
Immediate Anesthesia Transfer of Care Note  Patient: Nicole Reed  Procedure(s) Performed: ROBOTIC ASSISTED LAPAROSCOPIC OVARIAN CYSTECTOMY, ABLATION OF RIGHT AND LEFT OVARIAN ENDOMETRIOSIS, EXCISION OF CULDESAC ENDOMETRIOSIS (Right ) ROBOTIC ASSISTED LAPAROSCOPIC LYSIS OF ADHESION  Patient Location: PACU  Anesthesia Type:General  Level of Consciousness: awake, alert  and oriented  Airway & Oxygen Therapy: Patient Spontanous Breathing and Patient connected to nasal cannula oxygen  Post-op Assessment: Report given to RN, Post -op Vital signs reviewed and stable and Patient moving all extremities  Post vital signs: Reviewed and stable  Last Vitals:  Vitals:   10/11/17 0641  BP: 127/83  Pulse: 73  Resp: 15  Temp: 36.9 C  SpO2: 99%    Last Pain:  Vitals:   10/11/17 0641  TempSrc: Oral      Patients Stated Pain Goal: 4 (10/11/17 0641)  Complications: No apparent anesthesia complications

## 2017-10-11 NOTE — Op Note (Signed)
10/11/2017  10:09 AM  PATIENT:  Nicole Reed  31 y.o. female  PRE-OPERATIVE DIAGNOSIS:  Right Complex Ovarian Cyst  POST-OPERATIVE DIAGNOSIS:  Right Complex Ovarian Cyst  PROCEDURE:  Procedure(s): ROBOTIC ASSISTED LAPAROSCOPIC RIGHT OVARIAN CYSTECTOMY, ABLATION OF RIGHT AND LEFT OVARIAN ENDOMETRIOSIS, EXCISION OF CULDESAC ENDOMETRIOSIS ROBOTIC ASSISTED LAPAROSCOPIC LYSIS OF ADHESIONS IN CUL DE SAC , RIGHT AND LEFT ADNEXA  SURGEON:  Surgeon(s): Olivia Mackieaavon, Eilis Chestnutt, MD Maxie Betterousins, Sheronette, MD  ASSISTANTS: COUSINS, MD   ANESTHESIA:   local and general  ESTIMATED BLOOD LOSS: 25 mL   DRAINS: Urinary Catheter (Foley)   LOCAL MEDICATIONS USED:  MARCAINE    and Amount: 30 ml  SPECIMEN:  Source of Specimen:  RIGHT OVARIAN CYST WALL, CUL DE SAC IMPLANTS  DISPOSITION OF SPECIMEN:  PATHOLOGY  COUNTS:  YES  DICTATION #: [id number]  PLAN OF CARE: DC HOME  PATIENT DISPOSITION:  PACU - hemodynamically stable.

## 2017-10-11 NOTE — Anesthesia Preprocedure Evaluation (Signed)
Anesthesia Evaluation  Patient identified by MRN, date of birth, ID band Patient awake    Reviewed: Allergy & Precautions, NPO status , Patient's Chart, lab work & pertinent test results  History of Anesthesia Complications (+) PONV and history of anesthetic complications  Airway Mallampati: II  TM Distance: >3 FB Neck ROM: Full    Dental no notable dental hx. (+) Dental Advisory Given   Pulmonary asthma ,    Pulmonary exam normal        Cardiovascular negative cardio ROS Normal cardiovascular exam     Neuro/Psych  Headaches, negative psych ROS   GI/Hepatic Neg liver ROS, GERD  Medicated and Controlled,  Endo/Other  Hypothyroidism   Renal/GU negative Renal ROS     Musculoskeletal   Abdominal   Peds  Hematology   Anesthesia Other Findings   Reproductive/Obstetrics                             Anesthesia Physical  Anesthesia Plan  ASA: III  Anesthesia Plan: General   Post-op Pain Management:    Induction: Intravenous  PONV Risk Score and Plan: 3 and Treatment may vary due to age or medical condition, Ondansetron, Dexamethasone and Scopolamine patch - Pre-op  Airway Management Planned: Oral ETT  Additional Equipment:   Intra-op Plan:   Post-operative Plan: Extubation in OR  Informed Consent: I have reviewed the patients History and Physical, chart, labs and discussed the procedure including the risks, benefits and alternatives for the proposed anesthesia with the patient or authorized representative who has indicated his/her understanding and acceptance.   Dental advisory given  Plan Discussed with: CRNA and Anesthesiologist  Anesthesia Plan Comments:         Anesthesia Quick Evaluation

## 2017-10-11 NOTE — Discharge Instructions (Signed)
Post Anesthesia Home Care Instructions  Activity: Get plenty of rest for the remainder of the day. A responsible individual must stay with you for 24 hours following the procedure.  For the next 24 hours, DO NOT: -Drive a car -Operate machinery -Drink alcoholic beverages -Take any medication unless instructed by your physician -Make any legal decisions or sign important papers.  Meals: Start with liquid foods such as gelatin or soup. Progress to regular foods as tolerated. Avoid greasy, spicy, heavy foods. If nausea and/or vomiting occur, drink only clear liquids until the nausea and/or vomiting subsides. Call your physician if vomiting continues.  Special Instructions/Symptoms: Your throat may feel dry or sore from the anesthesia or the breathing tube placed in your throat during surgery. If this causes discomfort, gargle with warm salt water. The discomfort should disappear within 24 hours.  If you had a scopolamine patch placed behind your ear for the management of post- operative nausea and/or vomiting:  1. The medication in the patch is effective for 72 hours, after which it should be removed.  Wrap patch in a tissue and discard in the trash. Wash hands thoroughly with soap and water. 2. You may remove the patch earlier than 72 hours if you experience unpleasant side effects which may include dry mouth, dizziness or visual disturbances. 3. Avoid touching the patch. Wash your hands with soap and water after contact with the patch.    DISCHARGE INSTRUCTIONS: Laparoscopy  The following instructions have been prepared to help you care for yourself upon your return home today.  Wound care: . Do not get the incision wet for the first 24 hours. The incision should be kept clean and dry. . The Band-Aids or dressings may be removed the day after surgery. . Should the incision become sore, red, and swollen after the first week, check with your doctor.  Personal hygiene: . Shower the day  after your procedure.  Activity and limitations: . Do NOT drive or operate any equipment today. . Do NOT lift anything more than 15 pounds for 2-3 weeks after surgery. . Do NOT rest in bed all day. . Walking is encouraged. Walk each day, starting slowly with 5-minute walks 3 or 4 times a day. Slowly increase the length of your walks. . Walk up and down stairs slowly. . Do NOT do strenuous activities, such as golfing, playing tennis, bowling, running, biking, weight lifting, gardening, mowing, or vacuuming for 2-4 weeks. Ask your doctor when it is okay to start.  Diet: Eat a light meal as desired this evening. You may resume your usual diet tomorrow.  Return to work: This is dependent on the type of work you do. For the most part you can return to a desk job within a week of surgery. If you are more active at work, please discuss this with your doctor.  What to expect after your surgery: You may have a slight burning sensation when you urinate on the first day. You may have a very small amount of blood in the urine. Expect to have a small amount of vaginal discharge/light bleeding for 1-2 weeks. It is not unusual to have abdominal soreness and bruising for up to 2 weeks. You may be tired and need more rest for about 1 week. You may experience shoulder pain for 24-72 hours. Lying flat in bed may relieve it.  Call your doctor for any of the following: . Develop a fever of 100.4 or greater . Inability to urinate 6 hours after   discharge from hospital . Severe pain not relieved by pain medications . Persistent of heavy bleeding at incision site . Redness or swelling around incision site after a week . Increasing nausea or vomiting  Patient Signature________________________________________ Nurse Signature_________________________________________ 

## 2017-10-11 NOTE — Anesthesia Procedure Notes (Signed)
Procedure Name: Intubation Date/Time: 10/11/2017 8:11 AM Performed by: Hewitt Blade, CRNA Pre-anesthesia Checklist: Patient identified, Emergency Drugs available, Suction available and Patient being monitored Patient Re-evaluated:Patient Re-evaluated prior to induction Oxygen Delivery Method: Circle system utilized Preoxygenation: Pre-oxygenation with 100% oxygen Induction Type: IV induction Ventilation: Mask ventilation without difficulty Laryngoscope Size: Mac and 3 Grade View: Grade I Tube type: Oral Tube size: 7.0 mm Number of attempts: 1 Airway Equipment and Method: Stylet Placement Confirmation: ETT inserted through vocal cords under direct vision,  positive ETCO2 and breath sounds checked- equal and bilateral Secured at: 20 cm Tube secured with: Tape Dental Injury: Teeth and Oropharynx as per pre-operative assessment

## 2017-10-11 NOTE — Anesthesia Postprocedure Evaluation (Signed)
Anesthesia Post Note  Patient: Nicole Reed  Procedure(s) Performed: ROBOTIC ASSISTED LAPAROSCOPIC OVARIAN CYSTECTOMY, ABLATION OF RIGHT AND LEFT OVARIAN ENDOMETRIOSIS, EXCISION OF CULDESAC ENDOMETRIOSIS (Right ) ROBOTIC ASSISTED LAPAROSCOPIC LYSIS OF ADHESION     Patient location during evaluation: PACU Anesthesia Type: General Level of consciousness: awake and alert Pain management: pain level controlled Vital Signs Assessment: post-procedure vital signs reviewed and stable Respiratory status: spontaneous breathing, nonlabored ventilation, respiratory function stable and patient connected to nasal cannula oxygen Cardiovascular status: blood pressure returned to baseline and stable Postop Assessment: no apparent nausea or vomiting Anesthetic complications: no    Last Vitals:  Vitals:   10/11/17 1018 10/11/17 1045  BP: 105/78 136/85  Pulse: 100 81  Resp: 16 17  Temp: 36.7 C   SpO2: 92% 97%    Last Pain:  Vitals:   10/11/17 0641  TempSrc: Oral   Pain Goal: Patients Stated Pain Goal: 4 (10/11/17 1045)               [name]

## 2017-10-11 NOTE — Progress Notes (Signed)
Patient seen and examined. Consent witnessed and signed. No changes noted. Update completed.Patient ID: Nicole SparkJennifer Reed, female   DOB: 08-27-1986, 31 y.o.   MRN: [id number]

## 2017-10-12 ENCOUNTER — Encounter (HOSPITAL_COMMUNITY): Payer: Self-pay | Admitting: Obstetrics and Gynecology

## 2017-10-12 NOTE — Addendum Note (Signed)
Addendum  created 10/12/17 1418 by Donney DiceKelly, Axell Trigueros D, CRNA   Charge Capture section accepted

## 2017-10-12 NOTE — Op Note (Signed)
NAMHeide Reed:  Finken, [name]              ACCOUNT NO.:  [id number]  MEDICAL RECORD NO.:  [id number]  LOCATION:                                 FACILITY:  PHYSICIAN:  Lenoard Adenichard J. Nadine Ryle, M.D.     DATE OF BIRTH:  DATE OF PROCEDURE:  10/11/2017 DATE OF DISCHARGE:                              OPERATIVE REPORT   PREOPERATIVE DIAGNOSES:  Pelvic pain, complex right ovarian cyst, consistent with probable endometrioma.  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  PROCEDURES:  Da Vinci assisted robotic right ovarian cystectomy, excision of cul-de-sac, right and left adnexal endometriosis, ablation of right and left ovarian and posterior uterine wall endometriosis, lysis of adhesions of the sigmoid colon to the posterior uterine wall and left adnexa, lysis of left periovarian adhesions to the left pelvic sidewall, lysis of right paratubal and right ovary adhesions to the right pelvic sidewall.  SURGEON:  Lenoard Adenichard J. Erina Hamme, M.D.  ASSISTANT:  Maxie BetterSheronette Cousins, M.D.  ANESTHESIA:  General.  ESTIMATED BLOOD LOSS:  50 mL.  COMPLICATIONS:  None.  DRAINS:  Foley.  COUNTS:  Correct.  The patient to recovery in good condition.  BRIEF OPERATIVE NOTE:  After being apprised of the risks of anesthesia, infection, bleeding, injury to surrounding organs, possible need for repair, delayed versus immediate complications to include bowel and bladder injury, possible need for repair, inability to cure pelvic pain and/or prevent recurrent and possible endometriosis, the patient was brought to the operating room, where she was administered general anesthetic without complications, prepped and draped in usual sterile fashion, Foley catheter placed.  The patient has notably had  previous abdominal procedures to include sleeve gastrectomy and a laparoscopic cholecystectomy.  Umbilical incision was made with a scalpel.  Veress needle placed, opening pressure was +10, inability to enter the abdomen using the Veress needle was noted.  Therefore, decision was made to proceed with open approach.  Incision was slightly extended in the umbilical area.  Fascia was grasped, opened, and peritoneal entry was assured.  Hasson trocar placed.  Anchoring sutures placed with 0 Vicryl suture and visualization reveals atraumatic trocar entry.  No evidence of the anterior abdominal wall adhesions in the midline.  There were adhesions up in the right upper quadrant.  The diaphragm otherwise appears normal.  Exploring the pelvis reveals extensive adhesions in the posterior cul-de-sac of the sigmoid colon, right and left adnexa.  In the anterior cul-de-sac, there was a pedunculated fibroid, which was approximately 2 cm.  At this time, the robotic ports were placed, 1 on the right, 1 on the left, and an AirSeal port on the left.  All placed under direct visualization.  Deep Trendelenburg position was established.  Robot was docked in a standard fashion.  Extensive lysis of adhesions was done sharply to free up the uterine posterior cul-de- sac.  Ablation of endometriosis was done along the posterior uterine wall and cul-de-sac.  Adhesions were all excised in the left adnexa, creating an otherwise normal-appearing left ovary.  Surface ablation of endometriosis was performed.  Left tube appears normal.  There were some peritubal adhesions, which were sharply lysed without difficulty.  The right adnexa contained a large ovarian cyst, which was freed up and ruptured, consistent with chocolate cyst and right ovarian endometriosis.  Cyst wall was then teased out in a standard fashion and sent to Pathology.  The cyst cavity was further explored and ablated. There was lysis of adhesions of the  right ovary to the right pelvic sidewall, which was done to a degree, the posterior portion lying over the ureter, and was unable to be freed without the possibility of removing the right ovary, which otherwise appears normal.  Surface ablation of endometriosis done, lysis of peritubal adhesions.  The right tube does appear to have a somewhat dilated appearance, is not an obvious hydrosalpinx.  There is normal-appearing fimbria.  At this time, irrigation was accomplished.  Interceed was used to wrap the right ovarian cyst and the left and cut and there was a piece of Interceed placed in cul-de-sac as well.  Good hemostasis noted.  All instruments removed under direct visualization.  CO2 released.  Positive pressure applied.  Incisions were closed using 0 Vicryl, 4-0 Vicryl, Dermabond. Hulka tenaculum removed vaginally.  The patient tolerated the procedure well, transferred to recovery in good condition.     Lenoard Adenichard J. Brittlyn Cloe, M.D.     RJT/MEDQ  D:  10/11/2017  T:  10/12/2017  Job:  [id number]  cc:   Lenoard Adenichard J. Dawanna Grauberger, M.D. Fax: [phone]

## 2018-01-01 ENCOUNTER — Encounter: Payer: Self-pay | Admitting: Internal Medicine

## 2018-01-01 DIAGNOSIS — N809 Endometriosis, unspecified: Secondary | ICD-10-CM | POA: Diagnosis not present

## 2018-01-09 DIAGNOSIS — E039 Hypothyroidism, unspecified: Secondary | ICD-10-CM | POA: Diagnosis not present

## 2018-01-24 DIAGNOSIS — N809 Endometriosis, unspecified: Secondary | ICD-10-CM | POA: Diagnosis not present

## 2018-01-24 DIAGNOSIS — Z3141 Encounter for fertility testing: Secondary | ICD-10-CM | POA: Diagnosis not present

## 2018-02-20 DIAGNOSIS — N8500 Endometrial hyperplasia, unspecified: Secondary | ICD-10-CM | POA: Diagnosis not present

## 2018-04-03 DIAGNOSIS — N840 Polyp of corpus uteri: Secondary | ICD-10-CM | POA: Diagnosis not present

## 2018-04-09 DIAGNOSIS — N803 Endometriosis of pelvic peritoneum: Secondary | ICD-10-CM | POA: Diagnosis not present

## 2018-04-09 DIAGNOSIS — N809 Endometriosis, unspecified: Secondary | ICD-10-CM | POA: Diagnosis not present

## 2018-04-09 DIAGNOSIS — N808 Other endometriosis: Secondary | ICD-10-CM | POA: Diagnosis not present

## 2018-04-09 DIAGNOSIS — N801 Endometriosis of ovary: Secondary | ICD-10-CM | POA: Diagnosis not present

## 2018-04-09 DIAGNOSIS — N840 Polyp of corpus uteri: Secondary | ICD-10-CM | POA: Diagnosis not present

## 2018-06-11 DIAGNOSIS — Z3200 Encounter for pregnancy test, result unknown: Secondary | ICD-10-CM | POA: Diagnosis not present

## 2018-06-26 DIAGNOSIS — N809 Endometriosis, unspecified: Secondary | ICD-10-CM | POA: Diagnosis not present

## 2018-06-26 DIAGNOSIS — N978 Female infertility of other origin: Secondary | ICD-10-CM | POA: Diagnosis not present

## 2018-06-26 DIAGNOSIS — Z3141 Encounter for fertility testing: Secondary | ICD-10-CM | POA: Diagnosis not present

## 2018-07-02 DIAGNOSIS — D649 Anemia, unspecified: Secondary | ICD-10-CM | POA: Diagnosis not present

## 2018-07-07 IMAGING — MG 2D DIGITAL DIAGNOSTIC BILATERAL MAMMOGRAM WITH CAD AND ADJUNCT T
8 of 15 series · 8 of 35 positions shown · non-contrast
Comparison: None.

CLINICAL DATA: 31-year-old female presenting for evaluation of a
palpable left breast lump identified 1 week ago. Her mother has
history of breast cancer diagnosed at age 50. Her sister has tested
positive for BRCA 2 gene mutation, but has not had breast cancer.
The patient was tested for gene mutation and was negative.

EXAM:
2D DIGITAL DIAGNOSTIC BILATERAL MAMMOGRAM WITH CAD AND ADJUNCT TOMO
BILATERAL BREAST ULTRASOUND

[R CC]
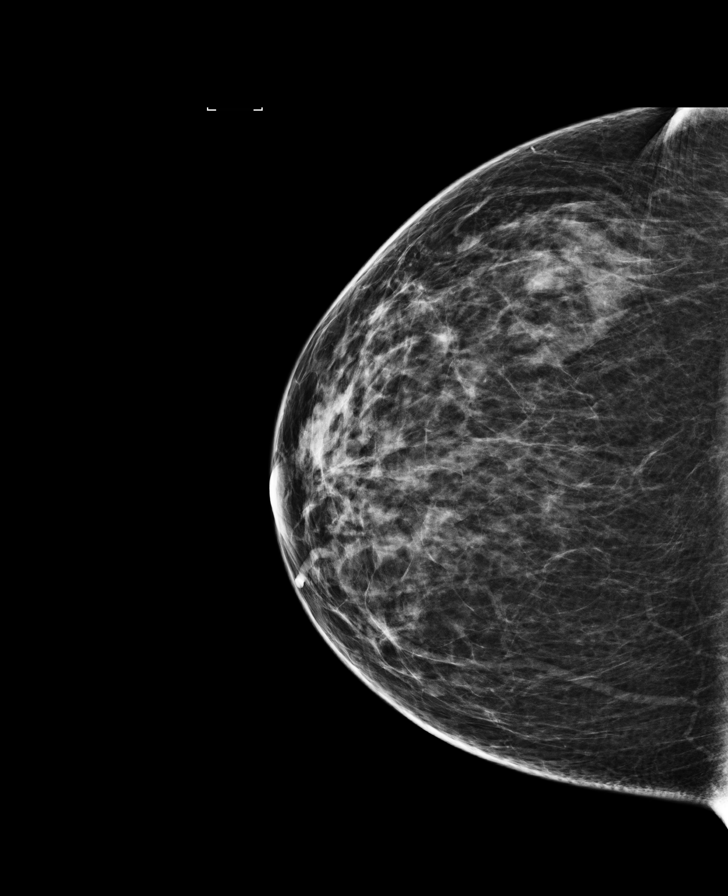

[R CC synth-2D]
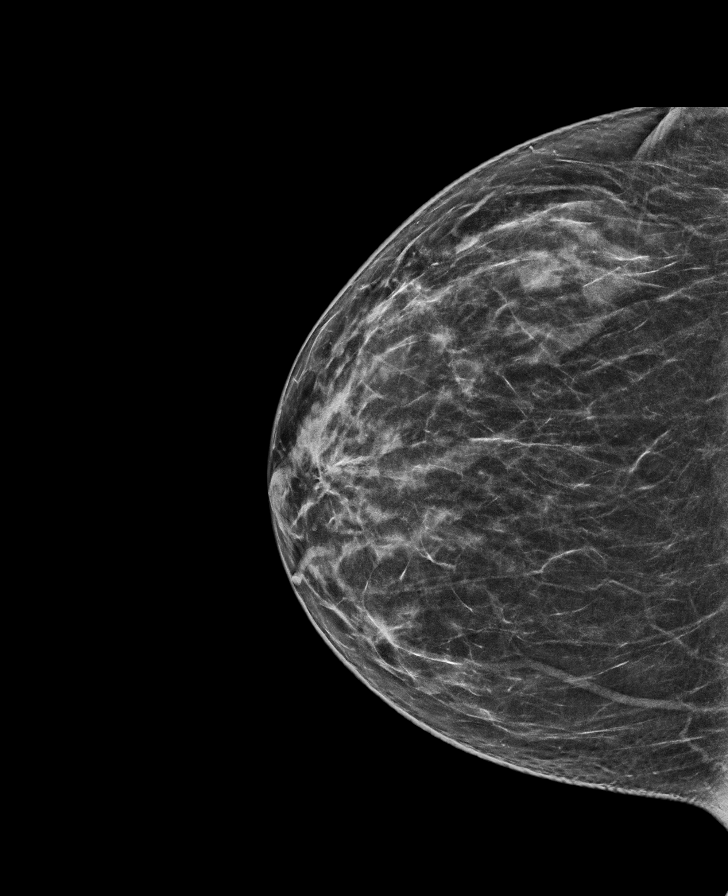

[L MLO synth-2D (1 of 2)]
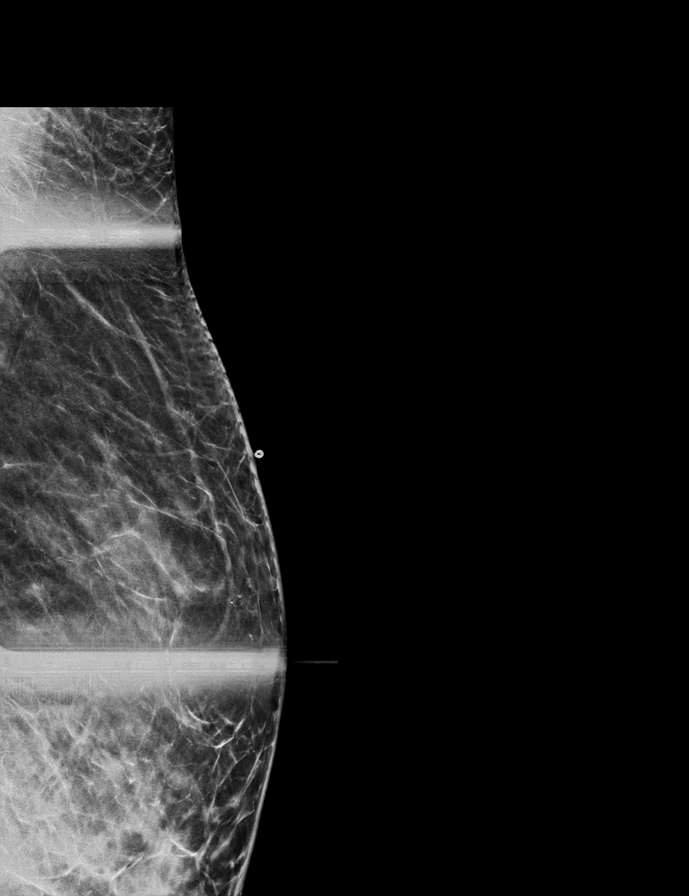

[L MLO synth-2D (2 of 2)]
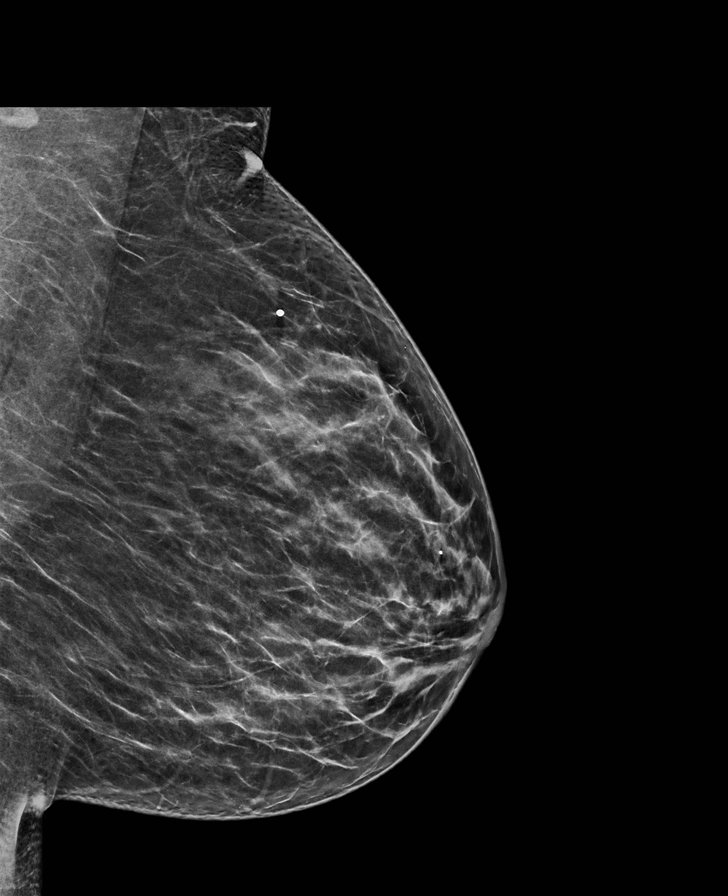

[L CC]
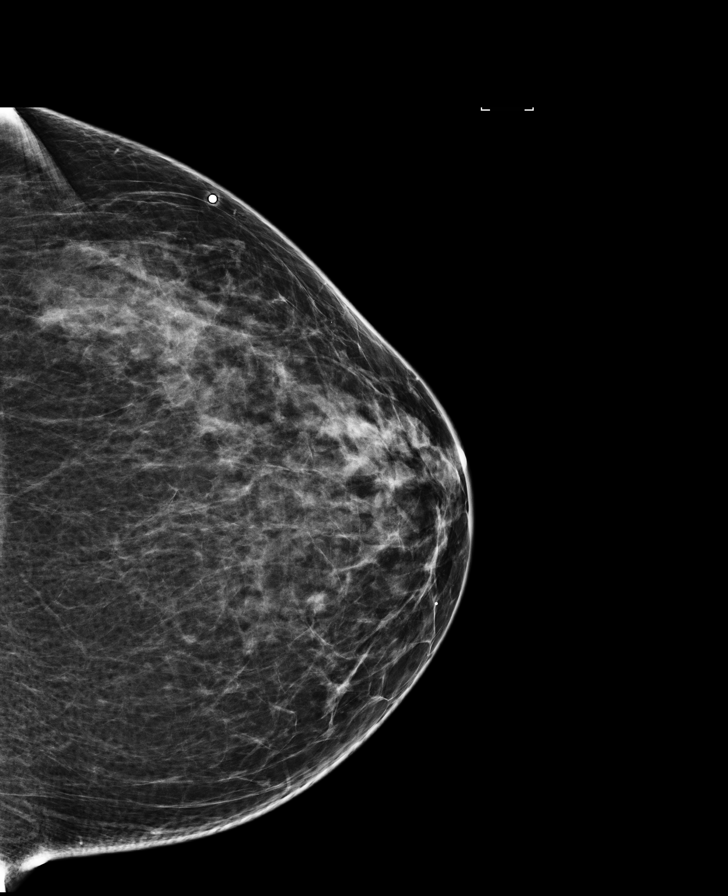

[L CC synth-2D]
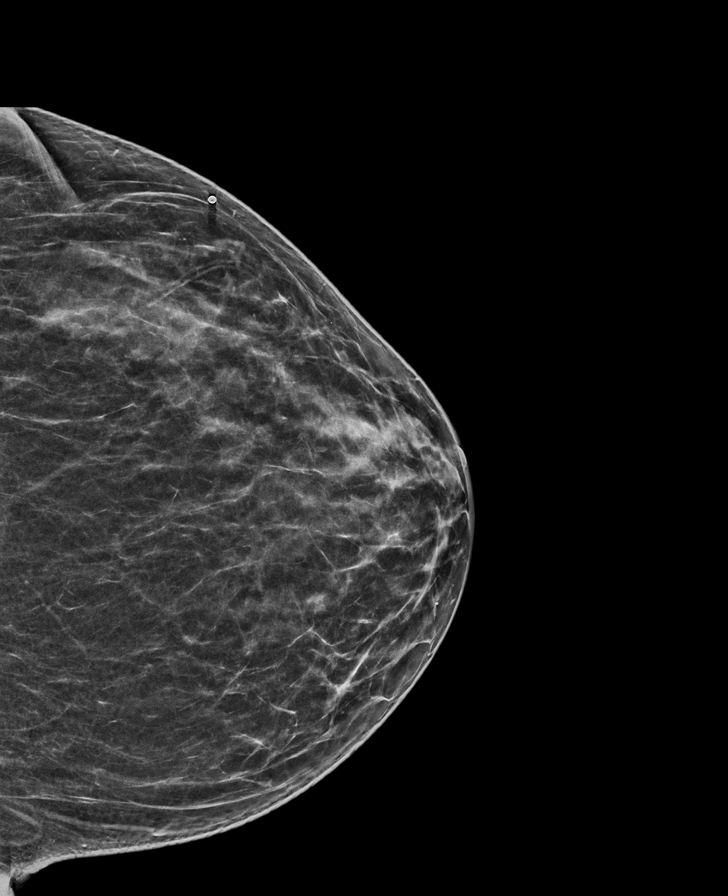

[L MLO]
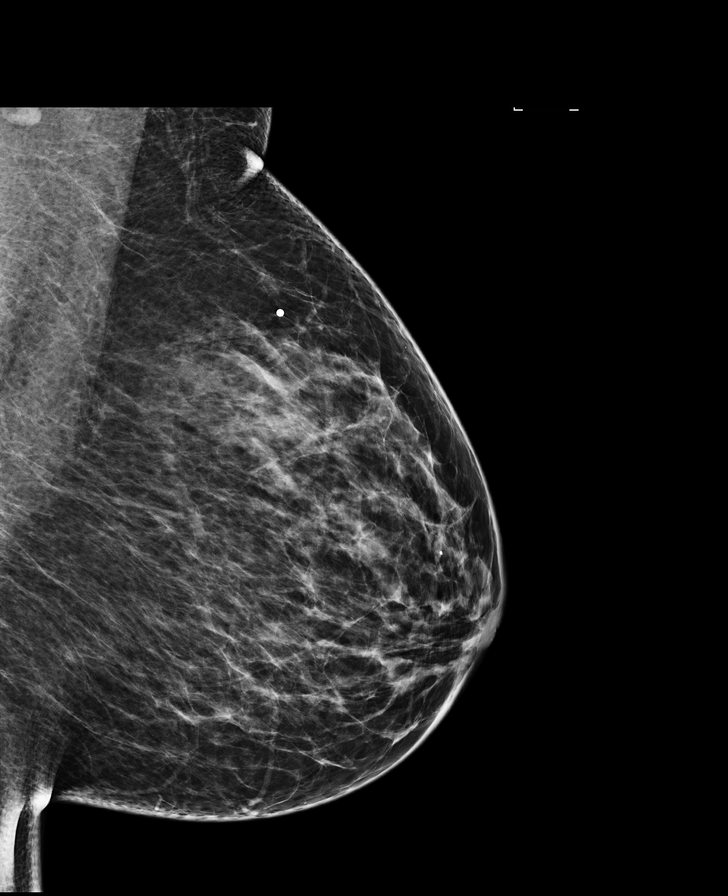

[R MLO synth-2D]
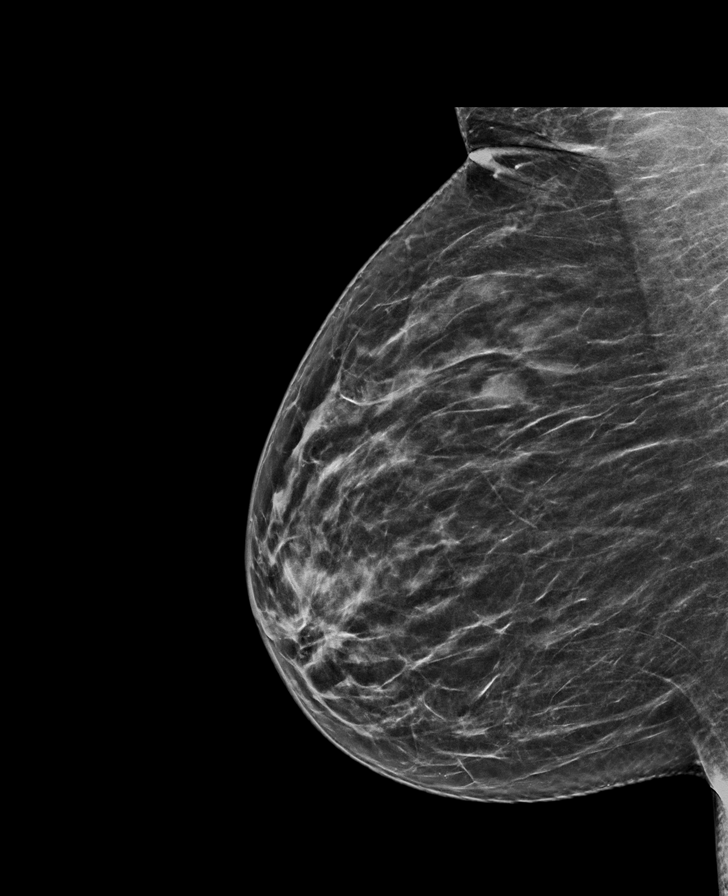

[8 of 35 positions shown; findings below may reference images not displayed]

ACR Breast Density Category c: The breast tissue is heterogeneously
dense, which may obscure small masses.
FINDINGS: A BB has been placed on the upper-outer quadrant of the left breast
indicating the palpable site of concern. No suspicious mammographic
findings are identified deep to the palpable marker. In the
upper-outer quadrant of the right breast, posterior depth there is a
1.4 cm oval mass. No other suspicious calcifications, masses or
areas of distortion are seen in the bilateral breasts.

Mammographic images were processed with CAD.

No palpable masses are identified in the upper-outer quadrant of the
right breast on physical exam. There is a firm ridge of tissue in
the upper-outer quadrant of the left breast at the palpable site of
concern.

Ultrasound of the right breast at 10 o'clock, 5 cm from the nipple
demonstrates an oval hypoechoic predominantly circumscribed mass. No
blood flow was identified within the mass on color Doppler imaging.
There is a prominent lymph node in the right axilla with cortex
measuring up to approximately 3.6 mm.

Ultrasound targeted to the palpable site in the upper-outer left
breast demonstrates normal fibroglandular tissue. No masses or
suspicious areas of shadowing are identified.
IMPRESSION: 1.  There is an indeterminate right breast mass at 10 o'clock.

2.  There is a prominent borderline right axillary lymph node.

3. No mammographic or targeted sonographic abnormalities are
identified in the upper-outer left breast to correspond with
patient's palpable site of concern.

RECOMMENDATION:
1. Ultrasound-guided biopsy is recommended for the right breast
mass. This has been added on to today's interventional schedule at
245. If the results from the biopsy are positive, ultrasound-guided
biopsy of the right axillary lymph node should be considered.

2. Clinical follow-up recommended for the palpable area of concern
in the last breast. Any further workup should be based on clinical
grounds.

3. The patient states she has had genetic testing, and believe she
has had her lifetime risk of breast cancer assessed but does not
remember the percentage. Per American Cancer Society guidelines, if
the patient has a calculated lifetime risk of developing breast
cancer of greater than 20%, annual screening MRI of the breasts
would be recommended at the time of screening mammography.

I have discussed the findings and recommendations with the patient.
Results were also provided in writing at the conclusion of the
visit. If applicable, a reminder letter will be sent to the patient
regarding the next appointment.

BI-RADS CATEGORY  4: Suspicious.

## 2018-08-02 DIAGNOSIS — Z3200 Encounter for pregnancy test, result unknown: Secondary | ICD-10-CM | POA: Diagnosis not present

## 2018-08-05 DIAGNOSIS — Z3200 Encounter for pregnancy test, result unknown: Secondary | ICD-10-CM | POA: Diagnosis not present

## 2018-08-15 DIAGNOSIS — Z3200 Encounter for pregnancy test, result unknown: Secondary | ICD-10-CM | POA: Diagnosis not present

## 2018-08-17 DIAGNOSIS — O26891 Other specified pregnancy related conditions, first trimester: Secondary | ICD-10-CM | POA: Diagnosis not present

## 2018-08-17 DIAGNOSIS — O208 Other hemorrhage in early pregnancy: Secondary | ICD-10-CM | POA: Diagnosis not present

## 2018-08-17 DIAGNOSIS — O200 Threatened abortion: Secondary | ICD-10-CM | POA: Diagnosis not present

## 2018-08-17 DIAGNOSIS — O209 Hemorrhage in early pregnancy, unspecified: Secondary | ICD-10-CM | POA: Diagnosis not present

## 2018-08-17 DIAGNOSIS — Z3A01 Less than 8 weeks gestation of pregnancy: Secondary | ICD-10-CM | POA: Diagnosis not present

## 2018-08-19 DIAGNOSIS — O032 Embolism following incomplete spontaneous abortion: Secondary | ICD-10-CM | POA: Diagnosis not present

## 2018-08-21 DIAGNOSIS — O021 Missed abortion: Secondary | ICD-10-CM | POA: Diagnosis not present

## 2018-08-22 DIAGNOSIS — O021 Missed abortion: Secondary | ICD-10-CM | POA: Diagnosis not present

## 2019-01-17 DIAGNOSIS — Z20828 Contact with and (suspected) exposure to other viral communicable diseases: Secondary | ICD-10-CM | POA: Diagnosis not present

## 2019-01-19 DIAGNOSIS — J209 Acute bronchitis, unspecified: Secondary | ICD-10-CM | POA: Diagnosis not present

## 2019-01-19 DIAGNOSIS — J45901 Unspecified asthma with (acute) exacerbation: Secondary | ICD-10-CM | POA: Diagnosis not present

## 2019-01-22 DIAGNOSIS — N809 Endometriosis, unspecified: Secondary | ICD-10-CM | POA: Diagnosis not present

## 2019-01-22 DIAGNOSIS — E039 Hypothyroidism, unspecified: Secondary | ICD-10-CM | POA: Diagnosis not present

## 2019-01-22 DIAGNOSIS — Z319 Encounter for procreative management, unspecified: Secondary | ICD-10-CM | POA: Diagnosis not present

## 2019-02-17 ENCOUNTER — Ambulatory Visit (INDEPENDENT_AMBULATORY_CARE_PROVIDER_SITE_OTHER): Payer: BLUE CROSS/BLUE SHIELD | Admitting: Internal Medicine

## 2019-02-17 ENCOUNTER — Other Ambulatory Visit: Payer: Self-pay

## 2019-02-17 ENCOUNTER — Encounter: Payer: Self-pay | Admitting: Internal Medicine

## 2019-02-17 DIAGNOSIS — E039 Hypothyroidism, unspecified: Secondary | ICD-10-CM
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
## 2019-02-24 ENCOUNTER — Telehealth: Payer: Self-pay | Admitting: Internal Medicine

## 2019-02-24 ENCOUNTER — Other Ambulatory Visit: Payer: Self-pay

## 2019-02-24 MED ORDER — LEVOTHYROXINE SODIUM 200 MCG PO TABS: 150.0000 ug | ORAL_TABLET | Freq: Every day | ORAL | 3 refills | Status: DC

## 2019-02-24 NOTE — Telephone Encounter (Signed)
MEDICATION: Levothyroxine, 150 mcg by mouth daily before breakfast. Mon-Fri and 300 mcg on Sat and Sun  PHARMACY:  [facility] on [address]  IS THIS A 90 DAY SUPPLY :  yes  IS PATIENT OUT OF MEDICATION: yes  IF NOT; HOW MUCH IS LEFT:   LAST APPOINTMENT DATE: @4 /13/2020  NEXT APPOINTMENT DATE:@4 /30/2020  DO WE HAVE YOUR PERMISSION TO LEAVE A DETAILED MESSAGE:  OTHER COMMENTS: YES   **Let patient know to contact pharmacy at the end of the day to make sure medication is ready. **  ** Please notify patient to allow 48-72 hours to process**  **Encourage patient to contact the pharmacy for refills or they can request refills through [facility]**

## 2019-02-24 NOTE — Telephone Encounter (Signed)
Ok to fill? Lab appt 03/06/19

## 2019-02-24 NOTE — Telephone Encounter (Signed)
Refill sent.

## 2019-02-25 ENCOUNTER — Other Ambulatory Visit: Payer: Self-pay

## 2019-02-25 MED ORDER — LEVOTHYROXINE SODIUM 150 MCG PO TABS: 150.0000 ug | ORAL_TABLET | Freq: Every day | ORAL | 1 refills | Status: DC

## 2019-02-25 NOTE — Telephone Encounter (Signed)
Patient has called in regards to her refill. She received a dosage at the pharmacy of 200 mcg and wanted to check before taking if this is a new dosage or done by mistake.  Please Advise, Thanks

## 2019-02-25 NOTE — Telephone Encounter (Signed)
resent correct rx and called and informed pt

## 2019-02-26 ENCOUNTER — Encounter (HOSPITAL_BASED_OUTPATIENT_CLINIC_OR_DEPARTMENT_OTHER): Payer: Self-pay | Admitting: Emergency Medicine

## 2019-03-06 ENCOUNTER — Other Ambulatory Visit: Payer: Self-pay

## 2019-03-06 ENCOUNTER — Other Ambulatory Visit (INDEPENDENT_AMBULATORY_CARE_PROVIDER_SITE_OTHER): Payer: Self-pay

## 2019-03-06 DIAGNOSIS — E039 Hypothyroidism, unspecified: Secondary | ICD-10-CM

## 2019-03-06 LAB — T4, FREE: Free T4: 1.26 ng/dL (ref 0.60–1.60)

## 2019-03-06 LAB — TSH: TSH: 0.04 u[IU]/mL — ABNORMAL LOW (ref 0.35–4.50)

## 2019-03-07 ENCOUNTER — Telehealth: Payer: Self-pay | Admitting: Internal Medicine

## 2019-03-07 MED ORDER — LEVOTHYROXINE SODIUM 150 MCG PO TABS: ORAL_TABLET | ORAL | 1 refills | Status: DC

## 2019-03-07 NOTE — Telephone Encounter (Signed)
Your thyroid function shows you are on too much levothyroxine at this time.   Please make the current levothyroxine changes  Levothyroxine 150 mcg daily Monday through Saturday  Continue Levothyroxine at 300 mcg daily on Sunday, will recheck on next visit.     Abby Raelyn Mora, MD  [facility] Endocrinology  [facility] [address] [place], [place] [address] Phone: [phone] FAX: [phone]

## 2019-03-17 DIAGNOSIS — N809 Endometriosis, unspecified: Secondary | ICD-10-CM | POA: Diagnosis not present

## 2019-03-17 DIAGNOSIS — Z3141 Encounter for fertility testing: Secondary | ICD-10-CM | POA: Diagnosis not present

## 2019-03-17 DIAGNOSIS — N8500 Endometrial hyperplasia, unspecified: Secondary | ICD-10-CM | POA: Diagnosis not present

## 2019-03-17 DIAGNOSIS — N96 Recurrent pregnancy loss: Secondary | ICD-10-CM | POA: Diagnosis not present

## 2019-03-26 DIAGNOSIS — N804 Endometriosis of rectovaginal septum and vagina: Secondary | ICD-10-CM | POA: Diagnosis not present

## 2019-03-27 IMAGING — CT CT ABD-PELV W/ CM
2 of 4 series · 17 of 46 positions shown, 19 images · IV contrast (APPLIED)
Comparison: None.

CLINICAL DATA: Right lower quadrant abdominal pain and flank pain
over the last 2 weeks.

EXAM:
CT ABDOMEN AND PELVIS WITH CONTRAST
TECHNIQUE: Multidetector CT imaging of the abdomen and pelvis was performed
using the standard protocol following bolus administration of
intravenous contrast.
CONTRAST:  100mL IEA2O3-9FF IOPAMIDOL (IEA2O3-9FF) INJECTION 61%

[Series 2: axial st · axial · 0.98mm/px · z∈[-497,-52]mm · 14 of 99 slices shown, 16 images]
[im 5/99  soft-tissue]
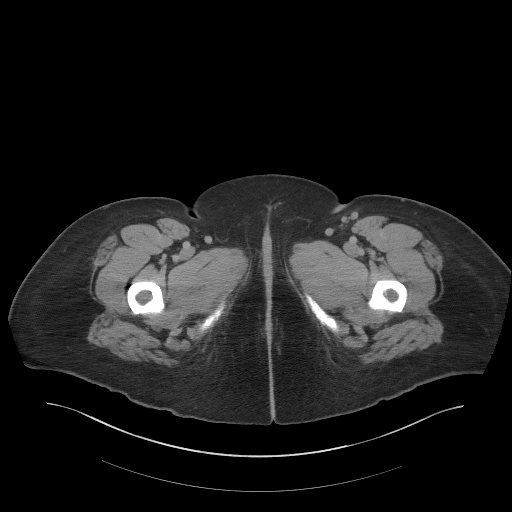
[im 5/99  bone]
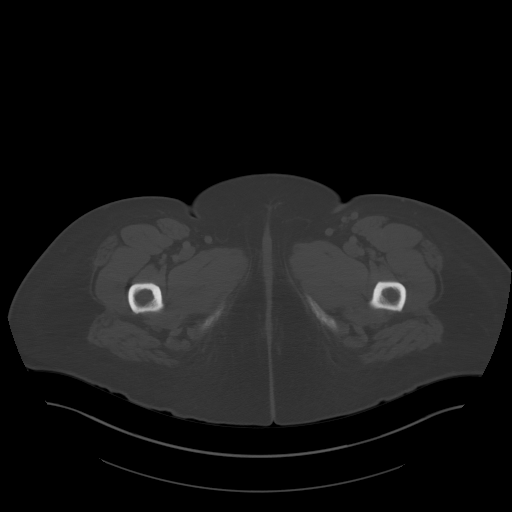
[im 13/99  soft-tissue]
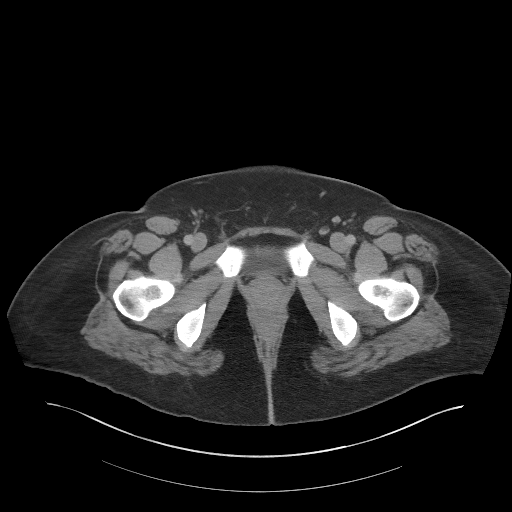
[im 21/99  soft-tissue]
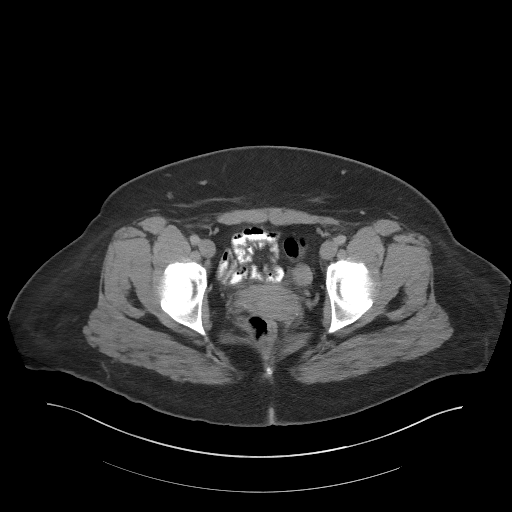
[im 25/99  soft-tissue]
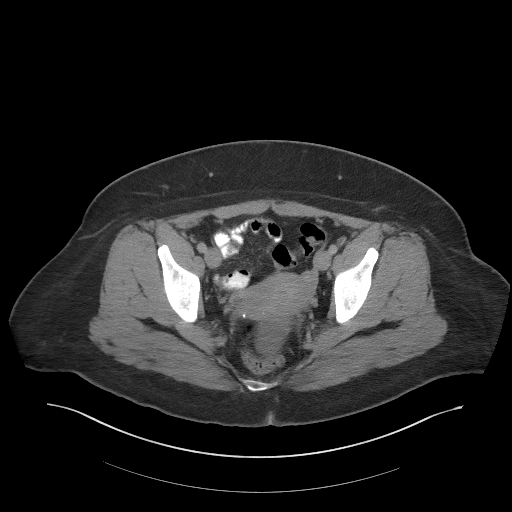
[im 33/99  soft-tissue]
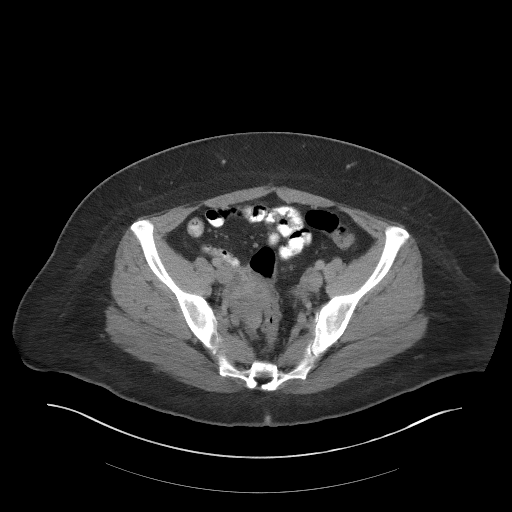
[im 41/99  soft-tissue]
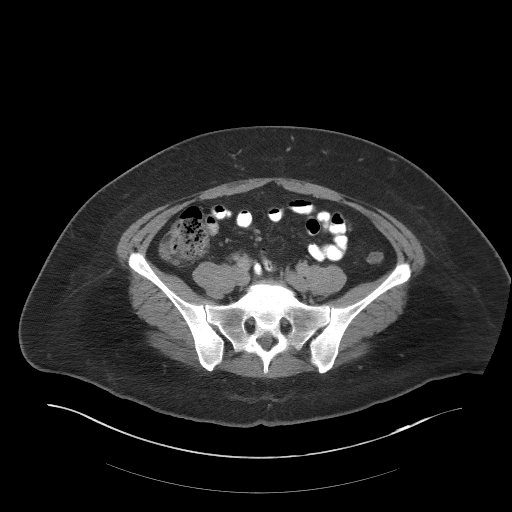
[im 45/99  soft-tissue]
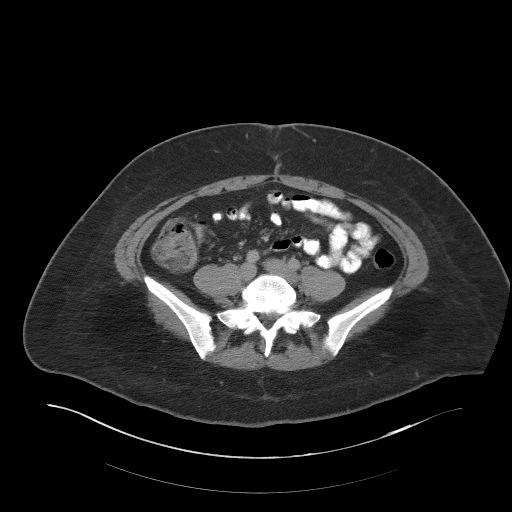
[im 54/99  soft-tissue]
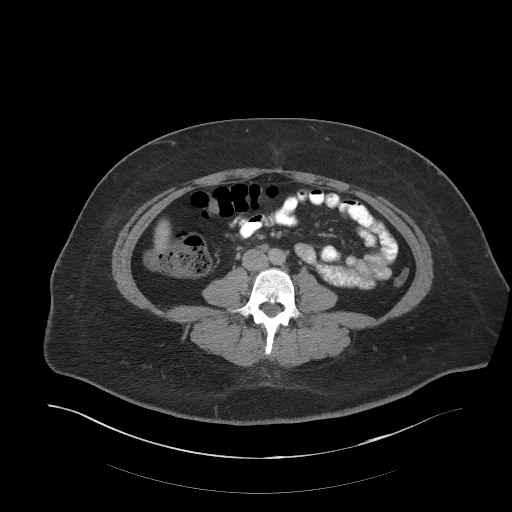
[im 58/99  soft-tissue]
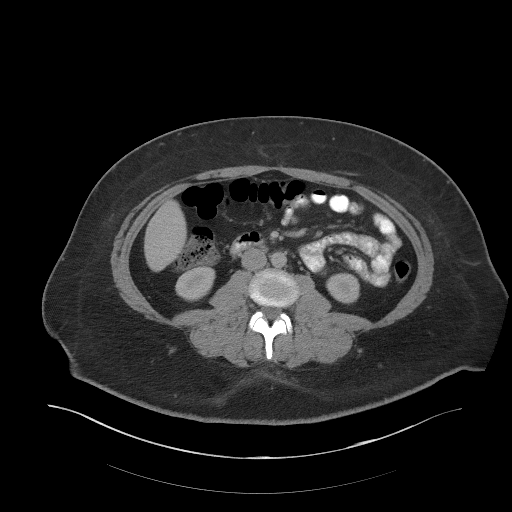
[im 58/99  bone]
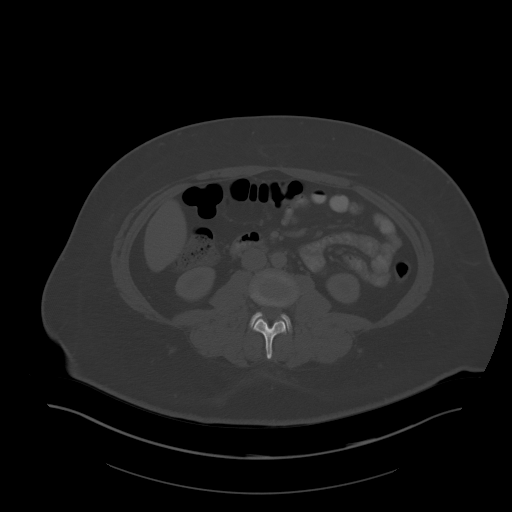
[im 66/99  soft-tissue]
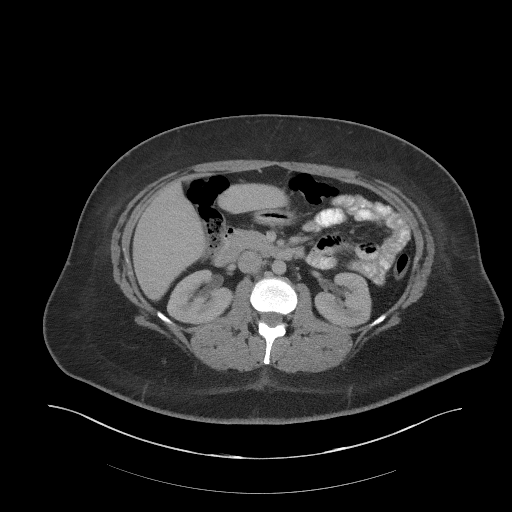
[im 74/99  soft-tissue]
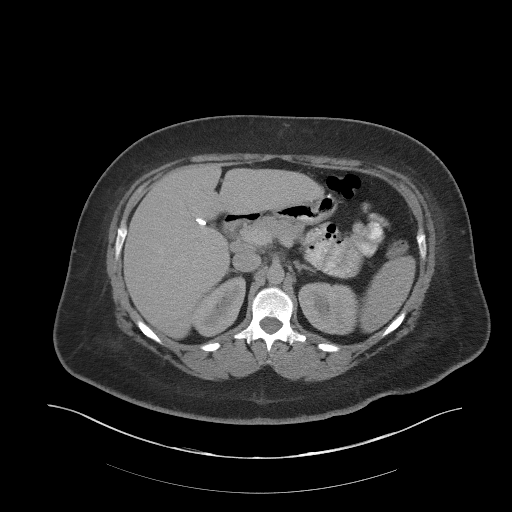
[im 78/99  soft-tissue]
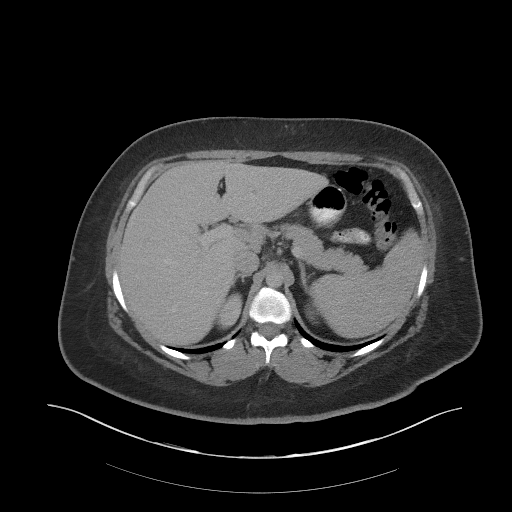
[im 86/99  soft-tissue]
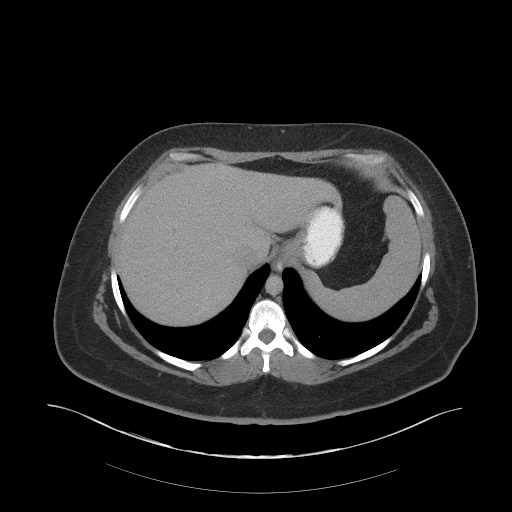
[im 94/99  soft-tissue]
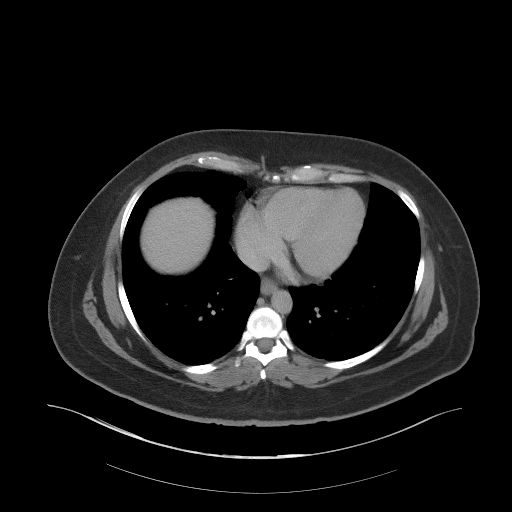

[Series 5: coronal st · coronal · 0.96mm/px · 3 of 92 slices shown]
[im 31/92  soft-tissue]
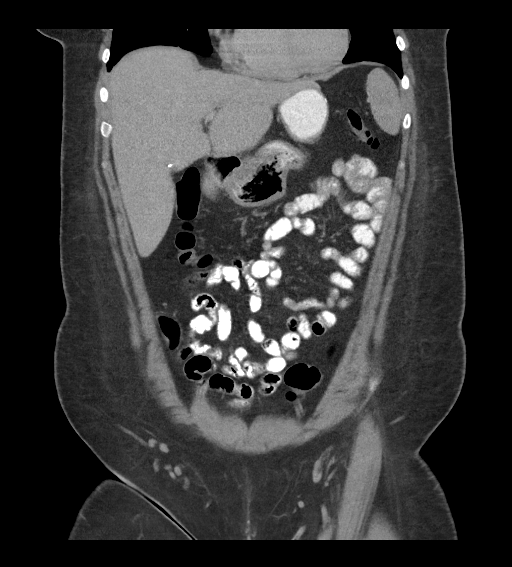
[im 41/92  soft-tissue]
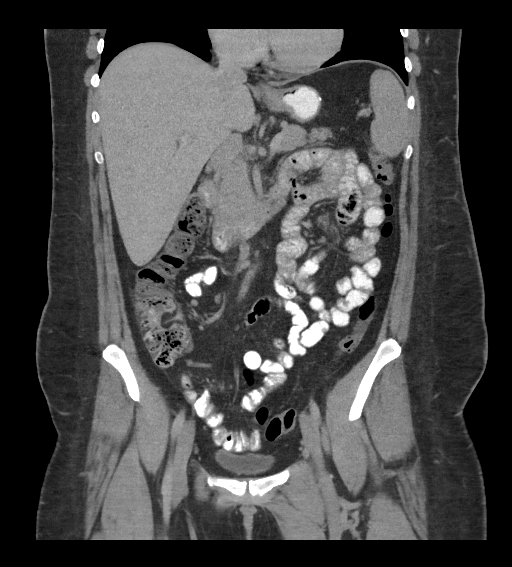
[im 51/92  soft-tissue]
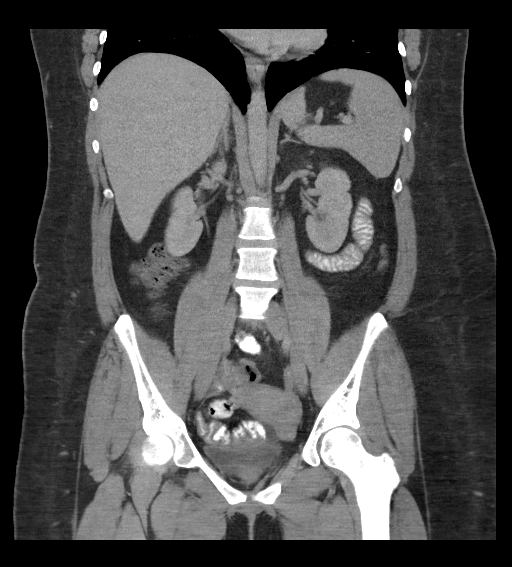

[17 of 46 positions shown; findings below may reference images not displayed]

FINDINGS: Lower chest: Normal

Hepatobiliary: The liver appears normal.  Previous cholecystectomy.

Pancreas: Normal

Spleen: Normal

Adrenals/Urinary Tract: Adrenal glands are normal. Kidneys are
normal. No cyst, mass, stone or hydronephrosis. Bladder is normal.

Stomach/Bowel: Normal appearing appendix. No other abnormal bowel
finding.

Vascular/Lymphatic: Normal

Reproductive: Free fluid in the pelvic cul de sac. Complex right
adnexal lesion presumed ovarian with solid and cystic components
measuring approximately 5 cm in diameter. This could be due to
pelvic inflammatory disease or functional ovarian cystic disease.
Tumor not excluded.

Other: No free air.

Musculoskeletal: Ordinary lower lumbar degenerative changes.
IMPRESSION: Right pelvic abnormality, presumed ovarian, probably representing
pelvic inflammatory disease without 5 cm cystic and solid area that
could represent tubo-ovarian abscess. Differential diagnosis
includes functional cyst and tumor. There is some fluid in the
pelvic cul de sac.

## 2019-04-15 ENCOUNTER — Encounter (HOSPITAL_COMMUNITY): Payer: Self-pay | Admitting: *Deleted

## 2019-04-15 ENCOUNTER — Other Ambulatory Visit: Payer: Self-pay

## 2019-04-15 ENCOUNTER — Other Ambulatory Visit (HOSPITAL_COMMUNITY)
Admission: RE | Admit: 2019-04-15 | Discharge: 2019-04-15 | Disposition: A | Discharge status: Home / Self Care | Payer: BC Managed Care – PPO | Source: Ambulatory Visit | Attending: Obstetrics and Gynecology | Admitting: Obstetrics and Gynecology

## 2019-04-15 DIAGNOSIS — Z01812 Encounter for preprocedural laboratory examination: Secondary | ICD-10-CM | POA: Diagnosis not present

## 2019-04-15 DIAGNOSIS — Z1159 Encounter for screening for other viral diseases: Secondary | ICD-10-CM | POA: Insufficient documentation

## 2019-04-15 LAB — SARS CORONAVIRUS 2 BY RT PCR (HOSPITAL ORDER, PERFORMED IN ~~LOC~~ HOSPITAL LAB): SARS Coronavirus 2: NEGATIVE

## 2019-04-15 MED ORDER — CEFAZOLIN SODIUM-DEXTROSE 2-4 GM/100ML-% IV SOLN
2.0000 g | INTRAVENOUS | Status: AC
  Administered 2019-04-16: 2 g via INTRAVENOUS
  Filled 2019-04-15: qty 100

## 2019-04-15 NOTE — Progress Notes (Addendum)
Nicole Reed denies chest pain or shortness of breath. Nicole Reed had covid test today and is quarantined at home with her husband.  I instructed patient to not eat after midnight, but she may drink clear liquids until 1410, patient is coming to Entrance A reception desk. to pick up a Pre Surgery Ensure, patient will wear a mask.  Patient instructed to drink Pre Surgery Ensure between 1:40 an 2:10 pm.

## 2019-04-16 ENCOUNTER — Encounter (HOSPITAL_COMMUNITY)
Admission: RE | Disposition: A | Discharge status: Home / Self Care | Payer: Self-pay | Source: Home / Self Care | Attending: Obstetrics and Gynecology

## 2019-04-16 ENCOUNTER — Ambulatory Visit (HOSPITAL_COMMUNITY)
Admission: RE | Admit: 2019-04-16 | Discharge: 2019-04-16 | Disposition: A | Discharge status: Home / Self Care | Payer: BC Managed Care – PPO | Attending: Obstetrics and Gynecology | Admitting: Obstetrics and Gynecology

## 2019-04-16 ENCOUNTER — Encounter (HOSPITAL_COMMUNITY): Payer: Self-pay | Admitting: *Deleted

## 2019-04-16 ENCOUNTER — Other Ambulatory Visit: Payer: Self-pay

## 2019-04-16 ENCOUNTER — Ambulatory Visit (HOSPITAL_COMMUNITY): Payer: BC Managed Care – PPO | Admitting: Vascular Surgery

## 2019-04-16 DIAGNOSIS — Z6841 Body Mass Index (BMI) 40.0 and over, adult: Secondary | ICD-10-CM | POA: Insufficient documentation

## 2019-04-16 DIAGNOSIS — Z8349 Family history of other endocrine, nutritional and metabolic diseases: Secondary | ICD-10-CM | POA: Diagnosis not present

## 2019-04-16 DIAGNOSIS — K449 Diaphragmatic hernia without obstruction or gangrene: Secondary | ICD-10-CM | POA: Insufficient documentation

## 2019-04-16 DIAGNOSIS — G43909 Migraine, unspecified, not intractable, without status migrainosus: Secondary | ICD-10-CM | POA: Diagnosis not present

## 2019-04-16 DIAGNOSIS — K219 Gastro-esophageal reflux disease without esophagitis: Secondary | ICD-10-CM | POA: Diagnosis not present

## 2019-04-16 DIAGNOSIS — R42 Dizziness and giddiness: Secondary | ICD-10-CM | POA: Insufficient documentation

## 2019-04-16 DIAGNOSIS — Z9049 Acquired absence of other specified parts of digestive tract: Secondary | ICD-10-CM | POA: Diagnosis not present

## 2019-04-16 DIAGNOSIS — N809 Endometriosis, unspecified: Secondary | ICD-10-CM | POA: Diagnosis not present

## 2019-04-16 DIAGNOSIS — E039 Hypothyroidism, unspecified: Secondary | ICD-10-CM | POA: Diagnosis not present

## 2019-04-16 DIAGNOSIS — J45909 Unspecified asthma, uncomplicated: Secondary | ICD-10-CM | POA: Diagnosis not present

## 2019-04-16 DIAGNOSIS — Z803 Family history of malignant neoplasm of breast: Secondary | ICD-10-CM | POA: Insufficient documentation

## 2019-04-16 DIAGNOSIS — E669 Obesity, unspecified: Secondary | ICD-10-CM | POA: Diagnosis not present

## 2019-04-16 DIAGNOSIS — N800 Endometriosis of uterus: Secondary | ICD-10-CM | POA: Diagnosis not present

## 2019-04-16 DIAGNOSIS — N840 Polyp of corpus uteri: Secondary | ICD-10-CM | POA: Insufficient documentation

## 2019-04-16 HISTORY — PX: DILATATION & CURETTAGE/HYSTEROSCOPY WITH MYOSURE: SHX6511

## 2019-04-16 LAB — CBC
HCT: 34.7 % — ABNORMAL LOW (ref 36.0–46.0)
Hemoglobin: 10.2 g/dL — ABNORMAL LOW (ref 12.0–15.0)
MCH: 20.1 pg — ABNORMAL LOW (ref 26.0–34.0)
MCHC: 29.4 g/dL — ABNORMAL LOW (ref 30.0–36.0)
MCV: 68.3 fL — ABNORMAL LOW (ref 80.0–100.0)
Platelets: 411 10*3/uL — ABNORMAL HIGH (ref 150–400)
RBC: 5.08 MIL/uL (ref 3.87–5.11)
RDW: 17 % — ABNORMAL HIGH (ref 11.5–15.5)
WBC: 10 10*3/uL (ref 4.0–10.5)
nRBC: 0 % (ref 0.0–0.2)

## 2019-04-16 LAB — POCT PREGNANCY, URINE: Preg Test, Ur: NEGATIVE

## 2019-04-16 LAB — TYPE AND SCREEN
ABO/RH(D): O POS
Antibody Screen: NEGATIVE

## 2019-04-16 SURGERY — DILATATION & CURETTAGE/HYSTEROSCOPY WITH MYOSURE
Anesthesia: General | Site: Vagina

## 2019-04-16 MED ORDER — ACETAMINOPHEN 10 MG/ML IV SOLN
1000.0000 mg | Freq: Once | INTRAVENOUS | Status: DC | PRN
  Administered 2019-04-16: 1000 mg via INTRAVENOUS

## 2019-04-16 MED ORDER — MIDAZOLAM HCL 2 MG/2ML IJ SOLN
INTRAMUSCULAR | Status: AC
  Filled 2019-04-16: qty 2

## 2019-04-16 MED ORDER — ONDANSETRON HCL 4 MG/2ML IJ SOLN
INTRAMUSCULAR | Status: DC | PRN
  Administered 2019-04-16: 4 mg via INTRAVENOUS

## 2019-04-16 MED ORDER — VASOPRESSIN 20 UNIT/ML IV SOLN
INTRAVENOUS | Status: AC
  Filled 2019-04-16: qty 1

## 2019-04-16 MED ORDER — FENTANYL CITRATE (PF) 250 MCG/5ML IJ SOLN
INTRAMUSCULAR | Status: AC
  Filled 2019-04-16: qty 5

## 2019-04-16 MED ORDER — SCOPOLAMINE 1 MG/3DAYS TD PT72
MEDICATED_PATCH | TRANSDERMAL | Status: AC
  Administered 2019-04-16: 1.5 mg via TRANSDERMAL
  Filled 2019-04-16: qty 1

## 2019-04-16 MED ORDER — LIDOCAINE 2% (20 MG/ML) 5 ML SYRINGE
INTRAMUSCULAR | Status: AC
  Filled 2019-04-16: qty 20

## 2019-04-16 MED ORDER — ACETAMINOPHEN 160 MG/5ML PO SOLN: 1000.0000 mg | Freq: Once | ORAL | Status: DC | PRN

## 2019-04-16 MED ORDER — SODIUM CHLORIDE (PF) 0.9 % IJ SOLN
INTRAMUSCULAR | Status: AC
  Filled 2019-04-16: qty 50

## 2019-04-16 MED ORDER — PROPOFOL 10 MG/ML IV BOLUS
INTRAVENOUS | Status: DC | PRN
  Administered 2019-04-16: 200 mg via INTRAVENOUS

## 2019-04-16 MED ORDER — SCOPOLAMINE 1 MG/3DAYS TD PT72
1.0000 | MEDICATED_PATCH | TRANSDERMAL | Status: DC
  Administered 2019-04-16: 1.5 mg via TRANSDERMAL
  Filled 2019-04-16: qty 1

## 2019-04-16 MED ORDER — PROPOFOL 10 MG/ML IV BOLUS
INTRAVENOUS | Status: AC
  Filled 2019-04-16: qty 20

## 2019-04-16 MED ORDER — ACETAMINOPHEN 10 MG/ML IV SOLN
INTRAVENOUS | Status: AC
  Filled 2019-04-16: qty 100

## 2019-04-16 MED ORDER — OXYCODONE HCL 5 MG PO TABS: 5.0000 mg | ORAL_TABLET | Freq: Once | ORAL | Status: DC | PRN

## 2019-04-16 MED ORDER — LIDOCAINE HCL (CARDIAC) PF 100 MG/5ML IV SOSY
PREFILLED_SYRINGE | INTRAVENOUS | Status: DC | PRN
  Administered 2019-04-16: 60 mg via INTRAVENOUS

## 2019-04-16 MED ORDER — LACTATED RINGERS IV SOLN
INTRAVENOUS | Status: DC
  Administered 2019-04-16 (×2): via INTRAVENOUS

## 2019-04-16 MED ORDER — VASOPRESSIN 20 UNIT/ML IV SOLN
INTRAVENOUS | Status: DC | PRN
  Administered 2019-04-16: 5 mL via INTRAMUSCULAR

## 2019-04-16 MED ORDER — MIDAZOLAM HCL 5 MG/5ML IJ SOLN
INTRAMUSCULAR | Status: DC | PRN
  Administered 2019-04-16: 2 mg via INTRAVENOUS

## 2019-04-16 MED ORDER — SODIUM CHLORIDE 0.9 % IR SOLN
Status: DC | PRN
  Administered 2019-04-16: 3000 mL

## 2019-04-16 MED ORDER — DEXAMETHASONE SODIUM PHOSPHATE 4 MG/ML IJ SOLN
INTRAMUSCULAR | Status: DC | PRN
  Administered 2019-04-16: 5 mg via INTRAVENOUS

## 2019-04-16 MED ORDER — OXYCODONE HCL 5 MG/5ML PO SOLN: 5.0000 mg | Freq: Once | ORAL | Status: DC | PRN

## 2019-04-16 MED ORDER — ACETAMINOPHEN 500 MG PO TABS: 1000.0000 mg | ORAL_TABLET | Freq: Once | ORAL | Status: DC | PRN

## 2019-04-16 MED ORDER — FENTANYL CITRATE (PF) 100 MCG/2ML IJ SOLN
INTRAMUSCULAR | Status: DC | PRN
  Administered 2019-04-16 (×2): 50 ug via INTRAVENOUS
  Administered 2019-04-16: 100 ug via INTRAVENOUS

## 2019-04-16 MED ORDER — FENTANYL CITRATE (PF) 100 MCG/2ML IJ SOLN: 25.0000 ug | INTRAMUSCULAR | Status: DC | PRN

## 2019-04-16 SURGICAL SUPPLY — 26 items
BIPOLAR CUTTING LOOP 21FR (ELECTRODE)
CANNULA CURETTE W/SYR 6 (CANNULA) ×1 IMPLANT
CANNULA CURETTE W/SYR 7 (CANNULA) IMPLANT
CATH NOVY CORNUAL CURVED 5.0 (CATHETERS) IMPLANT
CATH ROBINSON RED A/P 16FR (CATHETERS) ×2 IMPLANT
DRAPE C-ARM 42X72 X-RAY (DRAPES) IMPLANT
ELECT BIPOLAR KNIFE NDL PTD 7M (ELECTRODE) IMPLANT
ELECT REM PT RETURN 9FT ADLT (ELECTROSURGICAL)
ELECTRODE REM PT RTRN 9FT ADLT (ELECTROSURGICAL) IMPLANT
GLOVE BIO SURGEON STRL SZ8 (GLOVE) ×2 IMPLANT
GLOVE BIOGEL PI IND STRL 7.0 (GLOVE) ×1 IMPLANT
GLOVE BIOGEL PI IND STRL 8.5 (GLOVE) ×1 IMPLANT
GLOVE BIOGEL PI INDICATOR 7.0 (GLOVE) ×1
GLOVE BIOGEL PI INDICATOR 8.5 (GLOVE) ×1
GOWN STRL REUS W/ TWL LRG LVL3 (GOWN DISPOSABLE) ×2 IMPLANT
GOWN STRL REUS W/TWL LRG LVL3 (GOWN DISPOSABLE) ×4
HIBICLENS CHG 4% 4OZ BTL (MISCELLANEOUS) ×2 IMPLANT
KIT PROCEDURE FLUENT (KITS) ×2 IMPLANT
KIT TURNOVER KIT B (KITS) ×2 IMPLANT
LOOP CUTTING BIPOLAR 21FR (ELECTRODE) IMPLANT
PACK VAGINAL MINOR WOMEN LF (CUSTOM PROCEDURE TRAY) ×2 IMPLANT
PAD OB MATERNITY 4.3X12.25 (PERSONAL CARE ITEMS) ×2 IMPLANT
STENT BALLN UTERINE 3CM 6FR (STENTS) IMPLANT
STENT BALLN UTERINE 4CM 6FR (STENTS) IMPLANT
SUT SILK 2 0 SH (SUTURE) IMPLANT
TOWEL GREEN STERILE FF (TOWEL DISPOSABLE) ×4 IMPLANT
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
## 2019-04-16 NOTE — Discharge Instructions (Signed)

## 2019-04-16 NOTE — Anesthesia Procedure Notes (Signed)
Procedure Name: LMA Insertion Date/Time: 04/16/2019 6:47 PM Performed by: Oletta Lamas, CRNA Pre-anesthesia Checklist: Patient identified, Emergency Drugs available, Suction available and Patient being monitored Patient Re-evaluated:Patient Re-evaluated prior to induction Oxygen Delivery Method: Circle System Utilized Preoxygenation: Pre-oxygenation with 100% oxygen Induction Type: IV induction Ventilation: Mask ventilation without difficulty LMA: LMA inserted LMA Size: 4.0 Number of attempts: 1 Placement Confirmation: positive ETCO2 Tube secured with: Tape Dental Injury: Teeth and Oropharynx as per pre-operative assessment

## 2019-04-16 NOTE — Anesthesia Preprocedure Evaluation (Signed)
Anesthesia Evaluation  Patient identified by MRN, date of birth, ID band Patient awake    Reviewed: Allergy & Precautions, NPO status , Patient's Chart, lab work & pertinent test results  History of Anesthesia Complications (+) PONV and history of anesthetic complications  Airway Mallampati: III  TM Distance: >3 FB Neck ROM: Full    Dental  (+) Dental Advisory Given   Pulmonary neg shortness of breath, asthma , neg recent URI,    breath sounds clear to auscultation       Cardiovascular negative cardio ROS   Rhythm:Regular     Neuro/Psych  Headaches, negative psych ROS   GI/Hepatic hiatal hernia, GERD  ,  Endo/Other  Hypothyroidism   Renal/GU      Musculoskeletal negative musculoskeletal ROS (+)   Abdominal   Peds  Hematology negative hematology ROS (+)   Anesthesia Other Findings   Reproductive/Obstetrics                             Anesthesia Physical Anesthesia Plan  ASA: III  Anesthesia Plan: General   Post-op Pain Management:    Induction: Intravenous  PONV Risk Score and Plan: 4 or greater and Ondansetron, Dexamethasone and Scopolamine patch - Pre-op  Airway Management Planned: LMA and Oral ETT  Additional Equipment: None  Intra-op Plan:   Post-operative Plan: Extubation in OR  Informed Consent: I have reviewed the patients History and Physical, chart, labs and discussed the procedure including the risks, benefits and alternatives for the proposed anesthesia with the patient or authorized representative who has indicated his/her understanding and acceptance.     Dental advisory given  Plan Discussed with: CRNA and Surgeon  Anesthesia Plan Comments:         Anesthesia Quick Evaluation

## 2019-04-16 NOTE — Op Note (Signed)
OPERATIVE NOTE  Preoperative diagnosis: Endometrial polyp vsyoma  Postoperative diagnosis: Endometrial polyp  Procedure: Hysteroscopy, suction D&C  Surgeon: Governor Specking  Anesthesia: General  Complications: None  Estimated blood loss: Less than 20 mL  Specimen: Endometrial curettings to pathology  Findings: Endocervical canal appeared normal. The uterus sounded to 6 cm.  Endometrial cavity had three 5 x 5 mm polyps posteriorly in the lower uterine segment. Otherwise it was of small size and arcuate configuration.  Both tubal ostia were seen.  Description of procedure: Patient was placed in dorsal supine position. General anesthesia was administered. She was placed in lithotomy position. She was prepped and draped in sterile manner. A vaginal speculum was placed. A dilute vasopressin solution containing 0.33 units per milliliter was injected into the cervical stroma x5 cc. A MyoSure diagnostic hysteroscope with 0 lens was inserted into the canal and above findings were noted. Distention medium was 1.5% glycine. Distention method was a hysteroscopic pump set at 110 mm mercury. Above findings were noted.  Using a manual evacuation device (Handy Vac) with a 6 mm curette attached to it, suction curettage was performed and the specimen was sent to pathology.  Hysteroscopy was then repeated to make sure the polypoid areas had resolved.  Hemostasis was insured. Instrument count was correct. Estimated blood loss was less than 20 mL. The patient tolerated the procedure well and was transferred to recovery in satisfactory condition.  Governor Specking, MD

## 2019-04-16 NOTE — Transfer of Care (Signed)
Immediate Anesthesia Transfer of Care Note  Patient: Nicole Reed  Procedure(s) Performed: DILATATION & CURETTAGE/HYSTEROSCOPY WITH MYOSURE (N/A Vagina )  Patient Location: PACU  Anesthesia Type:General  Level of Consciousness: awake, alert , oriented and patient cooperative  Airway & Oxygen Therapy: Patient Spontanous Breathing  Post-op Assessment: Report given to RN and Post -op Vital signs reviewed and stable  Post vital signs: Reviewed and stable  Last Vitals:  Vitals Value Taken Time  BP 115/93 04/16/2019  7:29 PM  Temp    Pulse 94 04/16/2019  7:29 PM  Resp 14 04/16/2019  7:29 PM  SpO2 95 % 04/16/2019  7:29 PM  Vitals shown include unvalidated device data.  Last Pain:  Vitals:   04/16/19 1456  TempSrc: Oral         Complications: No apparent anesthesia complications

## 2019-04-17 ENCOUNTER — Encounter (HOSPITAL_COMMUNITY): Payer: Self-pay | Admitting: Obstetrics and Gynecology

## 2019-04-17 LAB — ABO/RH: ABO/RH(D): O POS

## 2019-04-22 NOTE — Anesthesia Postprocedure Evaluation (Signed)
Anesthesia Post Note  Patient: Nicole Reed  Procedure(s) Performed: DILATATION & CURETTAGE/HYSTEROSCOPY WITH MYOSURE (N/A Vagina )     Patient location during evaluation: PACU Anesthesia Type: General Level of consciousness: awake and alert Pain management: pain level controlled Vital Signs Assessment: post-procedure vital signs reviewed and stable Respiratory status: spontaneous breathing, nonlabored ventilation, respiratory function stable and patient connected to nasal cannula oxygen Cardiovascular status: stable and blood pressure returned to baseline Postop Assessment: no apparent nausea or vomiting Anesthetic complications: no    Last Vitals:  Vitals:   04/16/19 1959 04/16/19 2014  BP: 131/75 135/76  Pulse: 73 76  Resp: (!) 21 18  Temp:    SpO2: 100% 100%    Last Pain:  Vitals:   04/16/19 2014  TempSrc:   PainSc: 3                  Tatem Fesler

## 2019-05-01 ENCOUNTER — Ambulatory Visit: Payer: BLUE CROSS/BLUE SHIELD | Admitting: Internal Medicine

## 2019-05-07 ENCOUNTER — Ambulatory Visit: Payer: BC Managed Care – PPO | Admitting: Internal Medicine

## 2019-05-07 ENCOUNTER — Encounter: Payer: Self-pay | Admitting: Internal Medicine

## 2019-05-07 ENCOUNTER — Other Ambulatory Visit: Payer: Self-pay

## 2019-05-07 VITALS — BP 128/78 | HR 78 | Temp 99.0°F | Ht 66.0 in | Wt 269.4 lb

## 2019-05-07 DIAGNOSIS — N926 Irregular menstruation, unspecified: Secondary | ICD-10-CM | POA: Diagnosis not present

## 2019-05-07 DIAGNOSIS — E039 Hypothyroidism, unspecified: Secondary | ICD-10-CM | POA: Diagnosis not present

## 2019-05-07 DIAGNOSIS — N809 Endometriosis, unspecified: Secondary | ICD-10-CM | POA: Diagnosis not present

## 2019-05-07 LAB — T4, FREE: Free T4: 1.69 ng/dL — ABNORMAL HIGH (ref 0.60–1.60)

## 2019-05-07 LAB — TSH: TSH: <0.01 u[IU]/mL — ABNORMAL LOW (ref 0.35–4.50)
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
## 2019-05-07 NOTE — Patient Instructions (Signed)

## 2019-05-08 ENCOUNTER — Encounter: Payer: Self-pay | Admitting: Internal Medicine

## 2019-05-12 ENCOUNTER — Encounter: Payer: Self-pay | Admitting: Internal Medicine

## 2019-05-21 DIAGNOSIS — Z113 Encounter for screening for infections with a predominantly sexual mode of transmission: Secondary | ICD-10-CM | POA: Diagnosis not present

## 2019-06-04 DIAGNOSIS — Z3200 Encounter for pregnancy test, result unknown: Secondary | ICD-10-CM | POA: Diagnosis not present

## 2019-06-30 DIAGNOSIS — Z319 Encounter for procreative management, unspecified: Secondary | ICD-10-CM | POA: Diagnosis not present

## 2019-06-30 DIAGNOSIS — E039 Hypothyroidism, unspecified: Secondary | ICD-10-CM | POA: Diagnosis not present

## 2019-06-30 DIAGNOSIS — E288 Other ovarian dysfunction: Secondary | ICD-10-CM | POA: Diagnosis not present

## 2019-06-30 DIAGNOSIS — N809 Endometriosis, unspecified: Secondary | ICD-10-CM | POA: Diagnosis not present

## 2019-07-18 DIAGNOSIS — Z319 Encounter for procreative management, unspecified: Secondary | ICD-10-CM | POA: Diagnosis not present

## 2019-07-18 DIAGNOSIS — E288 Other ovarian dysfunction: Secondary | ICD-10-CM | POA: Diagnosis not present

## 2019-07-31 ENCOUNTER — Encounter (HOSPITAL_COMMUNITY): Payer: Self-pay

## 2019-08-18 DIAGNOSIS — Z3141 Encounter for fertility testing: Secondary | ICD-10-CM | POA: Diagnosis not present

## 2019-09-15 DIAGNOSIS — N809 Endometriosis, unspecified: Secondary | ICD-10-CM | POA: Diagnosis not present

## 2019-09-15 DIAGNOSIS — E039 Hypothyroidism, unspecified: Secondary | ICD-10-CM | POA: Diagnosis not present

## 2019-09-21 ENCOUNTER — Other Ambulatory Visit: Payer: Self-pay | Admitting: Internal Medicine

## 2019-09-29 DIAGNOSIS — N736 Female pelvic peritoneal adhesions (postinfective): Secondary | ICD-10-CM | POA: Diagnosis not present

## 2019-09-29 DIAGNOSIS — N809 Endometriosis, unspecified: Secondary | ICD-10-CM | POA: Diagnosis not present

## 2019-09-29 DIAGNOSIS — Z3141 Encounter for fertility testing: Secondary | ICD-10-CM | POA: Diagnosis not present

## 2019-09-29 DIAGNOSIS — N8500 Endometrial hyperplasia, unspecified: Secondary | ICD-10-CM | POA: Diagnosis not present

## 2019-10-13 DIAGNOSIS — N926 Irregular menstruation, unspecified: Secondary | ICD-10-CM | POA: Diagnosis not present

## 2019-10-13 DIAGNOSIS — E039 Hypothyroidism, unspecified: Secondary | ICD-10-CM | POA: Diagnosis not present

## 2019-10-13 DIAGNOSIS — N809 Endometriosis, unspecified: Secondary | ICD-10-CM | POA: Diagnosis not present

## 2019-10-13 DIAGNOSIS — N978 Female infertility of other origin: Secondary | ICD-10-CM | POA: Diagnosis not present

## 2019-10-13 DIAGNOSIS — Z113 Encounter for screening for infections with a predominantly sexual mode of transmission: Secondary | ICD-10-CM | POA: Diagnosis not present

## 2019-10-29 DIAGNOSIS — Z3200 Encounter for pregnancy test, result unknown: Secondary | ICD-10-CM | POA: Diagnosis not present

## 2019-10-29 DIAGNOSIS — Z3201 Encounter for pregnancy test, result positive: Secondary | ICD-10-CM | POA: Diagnosis not present

## 2019-11-03 DIAGNOSIS — Z3200 Encounter for pregnancy test, result unknown: Secondary | ICD-10-CM | POA: Diagnosis not present

## 2019-11-03 DIAGNOSIS — Z3201 Encounter for pregnancy test, result positive: Secondary | ICD-10-CM | POA: Diagnosis not present

## 2019-11-06 ENCOUNTER — Other Ambulatory Visit: Payer: Self-pay

## 2019-11-10 ENCOUNTER — Ambulatory Visit: Payer: BC Managed Care – PPO | Admitting: Internal Medicine

## 2019-11-10 ENCOUNTER — Encounter: Payer: Self-pay | Admitting: Internal Medicine

## 2019-11-10 VITALS — BP 118/78 | HR 88 | Temp 98.5°F | Ht 66.0 in | Wt 245.0 lb

## 2019-11-10 DIAGNOSIS — E039 Hypothyroidism, unspecified: Secondary | ICD-10-CM | POA: Insufficient documentation

## 2019-11-10 NOTE — Patient Instructions (Signed)
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
## 2019-11-12 DIAGNOSIS — Z3200 Encounter for pregnancy test, result unknown: Secondary | ICD-10-CM | POA: Diagnosis not present

## 2019-11-26 DIAGNOSIS — O0900 Supervision of pregnancy with history of infertility, unspecified trimester: Secondary | ICD-10-CM | POA: Diagnosis not present

## 2019-12-02 DIAGNOSIS — Z1589 Genetic susceptibility to other disease: Secondary | ICD-10-CM | POA: Insufficient documentation

## 2019-12-02 DIAGNOSIS — Z803 Family history of malignant neoplasm of breast: Secondary | ICD-10-CM | POA: Insufficient documentation

## 2019-12-02 DIAGNOSIS — G43909 Migraine, unspecified, not intractable, without status migrainosus: Secondary | ICD-10-CM | POA: Insufficient documentation

## 2019-12-02 DIAGNOSIS — J45909 Unspecified asthma, uncomplicated: Secondary | ICD-10-CM | POA: Insufficient documentation

## 2019-12-09 DIAGNOSIS — Z6841 Body Mass Index (BMI) 40.0 and over, adult: Secondary | ICD-10-CM | POA: Diagnosis not present

## 2019-12-09 DIAGNOSIS — Z833 Family history of diabetes mellitus: Secondary | ICD-10-CM | POA: Diagnosis not present

## 2019-12-09 DIAGNOSIS — Z9889 Other specified postprocedural states: Secondary | ICD-10-CM | POA: Diagnosis not present

## 2019-12-09 DIAGNOSIS — Z124 Encounter for screening for malignant neoplasm of cervix: Secondary | ICD-10-CM | POA: Diagnosis not present

## 2019-12-09 DIAGNOSIS — Z3480 Encounter for supervision of other normal pregnancy, unspecified trimester: Secondary | ICD-10-CM | POA: Diagnosis not present

## 2019-12-10 ENCOUNTER — Other Ambulatory Visit: Payer: Self-pay

## 2019-12-10 ENCOUNTER — Other Ambulatory Visit (INDEPENDENT_AMBULATORY_CARE_PROVIDER_SITE_OTHER): Payer: BC Managed Care – PPO

## 2019-12-10 ENCOUNTER — Telehealth: Payer: Self-pay | Admitting: Internal Medicine

## 2019-12-10 DIAGNOSIS — E039 Hypothyroidism, unspecified: Secondary | ICD-10-CM

## 2019-12-10 LAB — TSH: TSH: 0.03 u[IU]/mL — ABNORMAL LOW (ref 0.35–4.50)

## 2019-12-10 LAB — T4, FREE: Free T4: 1.19 ng/dL (ref 0.60–1.60)

## 2019-12-10 MED ORDER — LEVOTHYROXINE SODIUM 150 MCG PO TABS: 150.0000 ug | ORAL_TABLET | Freq: Every day | ORAL | 1 refills | Status: DC

## 2019-12-10 NOTE — Telephone Encounter (Signed)
Spoke to the pt on 12/10/2019 at 1715  Results for DELBERTA, FOLTS (MRN [id number]) as of 12/10/2019 17:16  Ref. Range 12/10/2019 10:07  TSH Latest Ref Range: 0.35 - 4.50 uIU/mL 0.03 (L)  T4,Free(Direct) Latest Ref Range: 0.60 - 1.60 ng/dL 0.78     Adjust levothyroxine as below   Levothyroxine 150  Mcg ,1 tab Monday-Saturday, 2 Tabs on Sunday    Pt expressed understanding   Abby Raelyn Mora, MD  [facility]  [facility] [address] [place], [place] [address] Phone: [phone] FAX: [phone]

## 2020-01-06 DIAGNOSIS — Z3480 Encounter for supervision of other normal pregnancy, unspecified trimester: Secondary | ICD-10-CM | POA: Diagnosis not present

## 2020-01-06 DIAGNOSIS — Z23 Encounter for immunization: Secondary | ICD-10-CM | POA: Diagnosis not present

## 2020-01-06 DIAGNOSIS — O09529 Supervision of elderly multigravida, unspecified trimester: Secondary | ICD-10-CM | POA: Diagnosis not present

## 2020-01-08 ENCOUNTER — Ambulatory Visit: Payer: BC Managed Care – PPO | Admitting: Internal Medicine

## 2020-01-08 ENCOUNTER — Other Ambulatory Visit: Payer: Self-pay

## 2020-01-08 ENCOUNTER — Encounter: Payer: Self-pay | Admitting: Internal Medicine

## 2020-01-08 VITALS — BP 124/84 | HR 93 | Temp 98.4°F | Ht 66.0 in | Wt 236.0 lb

## 2020-01-08 DIAGNOSIS — E039 Hypothyroidism, unspecified: Secondary | ICD-10-CM

## 2020-01-08 LAB — T4, FREE: Free T4: 0.83 ng/dL (ref 0.60–1.60)

## 2020-01-08 LAB — TSH: TSH: 0.03 u[IU]/mL — ABNORMAL LOW (ref 0.35–4.50)

## 2020-01-08 NOTE — Patient Instructions (Signed)
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
## 2020-01-09 ENCOUNTER — Encounter: Payer: Self-pay | Admitting: Internal Medicine

## 2020-01-22 DIAGNOSIS — O26872 Cervical shortening, second trimester: Secondary | ICD-10-CM | POA: Diagnosis not present

## 2020-01-26 DIAGNOSIS — N898 Other specified noninflammatory disorders of vagina: Secondary | ICD-10-CM | POA: Diagnosis not present

## 2020-01-28 ENCOUNTER — Other Ambulatory Visit (INDEPENDENT_AMBULATORY_CARE_PROVIDER_SITE_OTHER): Payer: BC Managed Care – PPO

## 2020-01-28 ENCOUNTER — Other Ambulatory Visit: Payer: Self-pay

## 2020-01-28 DIAGNOSIS — E039 Hypothyroidism, unspecified: Secondary | ICD-10-CM | POA: Diagnosis not present

## 2020-01-28 LAB — TSH: TSH: 0.09 u[IU]/mL — ABNORMAL LOW (ref 0.35–4.50)

## 2020-01-28 LAB — T4, FREE: Free T4: 0.78 ng/dL (ref 0.60–1.60)

## 2020-01-28 MED ORDER — LEVOTHYROXINE SODIUM 150 MCG PO TABS: 150.0000 ug | ORAL_TABLET | Freq: Every day | ORAL | 1 refills | Status: DC

## 2020-01-29 LAB — T4: T4, Total: 17.9 ug/dL — ABNORMAL HIGH (ref 5.1–11.9)

## 2020-02-10 DIAGNOSIS — Z3689 Encounter for other specified antenatal screening: Secondary | ICD-10-CM | POA: Diagnosis not present

## 2020-02-10 DIAGNOSIS — Z3686 Encounter for antenatal screening for cervical length: Secondary | ICD-10-CM | POA: Diagnosis not present

## 2020-03-15 DIAGNOSIS — O99284 Endocrine, nutritional and metabolic diseases complicating childbirth: Secondary | ICD-10-CM | POA: Diagnosis not present

## 2020-03-15 DIAGNOSIS — N390 Urinary tract infection, site not specified: Secondary | ICD-10-CM | POA: Diagnosis not present

## 2020-03-15 DIAGNOSIS — O321XX0 Maternal care for breech presentation, not applicable or unspecified: Secondary | ICD-10-CM | POA: Diagnosis not present

## 2020-03-15 DIAGNOSIS — O9952 Diseases of the respiratory system complicating childbirth: Secondary | ICD-10-CM | POA: Diagnosis not present

## 2020-03-15 DIAGNOSIS — O2342 Unspecified infection of urinary tract in pregnancy, second trimester: Secondary | ICD-10-CM | POA: Diagnosis not present

## 2020-03-15 DIAGNOSIS — O411220 Chorioamnionitis, second trimester, not applicable or unspecified: Secondary | ICD-10-CM | POA: Diagnosis not present

## 2020-03-15 DIAGNOSIS — Z3A24 24 weeks gestation of pregnancy: Secondary | ICD-10-CM | POA: Diagnosis not present

## 2020-03-15 DIAGNOSIS — O26892 Other specified pregnancy related conditions, second trimester: Secondary | ICD-10-CM | POA: Diagnosis not present

## 2020-03-15 DIAGNOSIS — O99214 Obesity complicating childbirth: Secondary | ICD-10-CM | POA: Diagnosis not present

## 2020-03-15 DIAGNOSIS — Z4682 Encounter for fitting and adjustment of non-vascular catheter: Secondary | ICD-10-CM | POA: Diagnosis not present

## 2020-03-15 DIAGNOSIS — O328XX0 Maternal care for other malpresentation of fetus, not applicable or unspecified: Secondary | ICD-10-CM | POA: Diagnosis not present

## 2020-03-15 DIAGNOSIS — O99282 Endocrine, nutritional and metabolic diseases complicating pregnancy, second trimester: Secondary | ICD-10-CM | POA: Diagnosis not present

## 2020-03-15 DIAGNOSIS — O42112 Preterm premature rupture of membranes, onset of labor more than 24 hours following rupture, second trimester: Secondary | ICD-10-CM | POA: Diagnosis not present

## 2020-03-15 DIAGNOSIS — O9902 Anemia complicating childbirth: Secondary | ICD-10-CM | POA: Diagnosis not present

## 2020-03-15 DIAGNOSIS — Z3A23 23 weeks gestation of pregnancy: Secondary | ICD-10-CM | POA: Diagnosis not present

## 2020-03-15 DIAGNOSIS — D509 Iron deficiency anemia, unspecified: Secondary | ICD-10-CM | POA: Diagnosis not present

## 2020-03-15 DIAGNOSIS — J4520 Mild intermittent asthma, uncomplicated: Secondary | ICD-10-CM | POA: Diagnosis not present

## 2020-03-15 DIAGNOSIS — E039 Hypothyroidism, unspecified: Secondary | ICD-10-CM | POA: Diagnosis not present

## 2020-03-15 DIAGNOSIS — O99844 Bariatric surgery status complicating childbirth: Secondary | ICD-10-CM | POA: Diagnosis not present

## 2020-03-15 DIAGNOSIS — R109 Unspecified abdominal pain: Secondary | ICD-10-CM | POA: Diagnosis not present

## 2020-03-15 DIAGNOSIS — Z452 Encounter for adjustment and management of vascular access device: Secondary | ICD-10-CM | POA: Diagnosis not present

## 2020-03-15 DIAGNOSIS — O99344 Other mental disorders complicating childbirth: Secondary | ICD-10-CM | POA: Diagnosis not present

## 2020-03-15 DIAGNOSIS — N3000 Acute cystitis without hematuria: Secondary | ICD-10-CM | POA: Diagnosis not present

## 2020-03-15 DIAGNOSIS — R221 Localized swelling, mass and lump, neck: Secondary | ICD-10-CM | POA: Diagnosis not present

## 2020-03-15 DIAGNOSIS — O99212 Obesity complicating pregnancy, second trimester: Secondary | ICD-10-CM | POA: Diagnosis not present

## 2020-03-15 DIAGNOSIS — F988 Other specified behavioral and emotional disorders with onset usually occurring in childhood and adolescence: Secondary | ICD-10-CM | POA: Diagnosis not present

## 2020-03-16 DIAGNOSIS — Z3A23 23 weeks gestation of pregnancy: Secondary | ICD-10-CM | POA: Diagnosis not present

## 2020-03-16 DIAGNOSIS — E039 Hypothyroidism, unspecified: Secondary | ICD-10-CM | POA: Diagnosis not present

## 2020-03-16 DIAGNOSIS — O99282 Endocrine, nutritional and metabolic diseases complicating pregnancy, second trimester: Secondary | ICD-10-CM | POA: Diagnosis not present

## 2020-03-16 DIAGNOSIS — O2342 Unspecified infection of urinary tract in pregnancy, second trimester: Secondary | ICD-10-CM | POA: Diagnosis not present

## 2020-03-17 DIAGNOSIS — O2342 Unspecified infection of urinary tract in pregnancy, second trimester: Secondary | ICD-10-CM | POA: Diagnosis not present

## 2020-03-19 DIAGNOSIS — O411220 Chorioamnionitis, second trimester, not applicable or unspecified: Secondary | ICD-10-CM | POA: Diagnosis not present

## 2020-03-19 DIAGNOSIS — O328XX0 Maternal care for other malpresentation of fetus, not applicable or unspecified: Secondary | ICD-10-CM | POA: Diagnosis not present

## 2020-03-19 DIAGNOSIS — Z452 Encounter for adjustment and management of vascular access device: Secondary | ICD-10-CM | POA: Diagnosis not present

## 2020-03-19 DIAGNOSIS — Z4682 Encounter for fitting and adjustment of non-vascular catheter: Secondary | ICD-10-CM | POA: Diagnosis not present

## 2020-03-19 DIAGNOSIS — O42112 Preterm premature rupture of membranes, onset of labor more than 24 hours following rupture, second trimester: Secondary | ICD-10-CM | POA: Diagnosis not present

## 2020-03-19 DIAGNOSIS — O99212 Obesity complicating pregnancy, second trimester: Secondary | ICD-10-CM | POA: Diagnosis not present

## 2020-03-19 DIAGNOSIS — Z3A24 24 weeks gestation of pregnancy: Secondary | ICD-10-CM | POA: Diagnosis not present

## 2020-03-20 DIAGNOSIS — Z4682 Encounter for fitting and adjustment of non-vascular catheter: Secondary | ICD-10-CM | POA: Diagnosis not present

## 2020-03-20 DIAGNOSIS — Z452 Encounter for adjustment and management of vascular access device: Secondary | ICD-10-CM | POA: Diagnosis not present

## 2020-03-23 DIAGNOSIS — Z452 Encounter for adjustment and management of vascular access device: Secondary | ICD-10-CM | POA: Diagnosis not present

## 2020-03-23 DIAGNOSIS — E611 Iron deficiency: Secondary | ICD-10-CM | POA: Insufficient documentation

## 2020-03-23 DIAGNOSIS — R221 Localized swelling, mass and lump, neck: Secondary | ICD-10-CM | POA: Diagnosis not present

## 2020-03-23 DIAGNOSIS — D509 Iron deficiency anemia, unspecified: Secondary | ICD-10-CM | POA: Insufficient documentation

## 2020-03-25 DIAGNOSIS — Z888 Allergy status to other drugs, medicaments and biological substances status: Secondary | ICD-10-CM | POA: Diagnosis not present

## 2020-03-25 DIAGNOSIS — Z79899 Other long term (current) drug therapy: Secondary | ICD-10-CM | POA: Diagnosis not present

## 2020-03-25 DIAGNOSIS — Z885 Allergy status to narcotic agent status: Secondary | ICD-10-CM | POA: Diagnosis not present

## 2020-03-25 DIAGNOSIS — F53 Postpartum depression: Secondary | ICD-10-CM | POA: Diagnosis not present

## 2020-03-25 DIAGNOSIS — E039 Hypothyroidism, unspecified: Secondary | ICD-10-CM | POA: Diagnosis not present

## 2020-03-25 DIAGNOSIS — D72829 Elevated white blood cell count, unspecified: Secondary | ICD-10-CM | POA: Diagnosis not present

## 2020-03-25 DIAGNOSIS — Z7901 Long term (current) use of anticoagulants: Secondary | ICD-10-CM | POA: Diagnosis not present

## 2020-03-25 DIAGNOSIS — K219 Gastro-esophageal reflux disease without esophagitis: Secondary | ICD-10-CM | POA: Diagnosis not present

## 2020-03-25 DIAGNOSIS — R918 Other nonspecific abnormal finding of lung field: Secondary | ICD-10-CM | POA: Diagnosis not present

## 2020-03-25 DIAGNOSIS — J45909 Unspecified asthma, uncomplicated: Secondary | ICD-10-CM | POA: Diagnosis not present

## 2020-03-25 DIAGNOSIS — R109 Unspecified abdominal pain: Secondary | ICD-10-CM | POA: Diagnosis not present

## 2020-03-25 DIAGNOSIS — G8918 Other acute postprocedural pain: Secondary | ICD-10-CM | POA: Diagnosis not present

## 2020-03-25 DIAGNOSIS — O8612 Endometritis following delivery: Secondary | ICD-10-CM | POA: Diagnosis not present

## 2020-03-25 DIAGNOSIS — Z79890 Hormone replacement therapy (postmenopausal): Secondary | ICD-10-CM | POA: Diagnosis not present

## 2020-03-25 DIAGNOSIS — R0689 Other abnormalities of breathing: Secondary | ICD-10-CM | POA: Diagnosis not present

## 2020-03-25 DIAGNOSIS — Z881 Allergy status to other antibiotic agents status: Secondary | ICD-10-CM | POA: Diagnosis not present

## 2020-03-25 DIAGNOSIS — Z452 Encounter for adjustment and management of vascular access device: Secondary | ICD-10-CM | POA: Diagnosis not present

## 2020-03-25 DIAGNOSIS — T8149XA Infection following a procedure, other surgical site, initial encounter: Secondary | ICD-10-CM | POA: Diagnosis not present

## 2020-03-25 DIAGNOSIS — R1030 Lower abdominal pain, unspecified: Secondary | ICD-10-CM | POA: Diagnosis not present

## 2020-03-25 DIAGNOSIS — L02211 Cutaneous abscess of abdominal wall: Secondary | ICD-10-CM | POA: Diagnosis not present

## 2020-03-25 DIAGNOSIS — O8601 Infection of obstetric surgical wound, superficial incisional site: Secondary | ICD-10-CM | POA: Diagnosis not present

## 2020-03-25 DIAGNOSIS — Z886 Allergy status to analgesic agent status: Secondary | ICD-10-CM | POA: Diagnosis not present

## 2020-03-25 DIAGNOSIS — O99345 Other mental disorders complicating the puerperium: Secondary | ICD-10-CM | POA: Diagnosis not present

## 2020-03-25 DIAGNOSIS — Z9049 Acquired absence of other specified parts of digestive tract: Secondary | ICD-10-CM | POA: Diagnosis not present

## 2020-03-25 DIAGNOSIS — K838 Other specified diseases of biliary tract: Secondary | ICD-10-CM | POA: Diagnosis not present

## 2020-03-25 DIAGNOSIS — Z20822 Contact with and (suspected) exposure to covid-19: Secondary | ICD-10-CM | POA: Diagnosis not present

## 2020-03-25 DIAGNOSIS — R231 Pallor: Secondary | ICD-10-CM | POA: Diagnosis not present

## 2020-03-25 DIAGNOSIS — Z4682 Encounter for fitting and adjustment of non-vascular catheter: Secondary | ICD-10-CM | POA: Diagnosis not present

## 2020-03-28 DIAGNOSIS — L02211 Cutaneous abscess of abdominal wall: Secondary | ICD-10-CM | POA: Diagnosis not present

## 2020-03-29 DIAGNOSIS — O8612 Endometritis following delivery: Secondary | ICD-10-CM | POA: Diagnosis not present

## 2020-03-29 DIAGNOSIS — R0689 Other abnormalities of breathing: Secondary | ICD-10-CM | POA: Diagnosis not present

## 2020-03-29 DIAGNOSIS — Z4682 Encounter for fitting and adjustment of non-vascular catheter: Secondary | ICD-10-CM | POA: Diagnosis not present

## 2020-03-29 DIAGNOSIS — R1030 Lower abdominal pain, unspecified: Secondary | ICD-10-CM | POA: Diagnosis not present

## 2020-03-29 DIAGNOSIS — R918 Other nonspecific abnormal finding of lung field: Secondary | ICD-10-CM | POA: Diagnosis not present

## 2020-03-29 DIAGNOSIS — T8149XA Infection following a procedure, other surgical site, initial encounter: Secondary | ICD-10-CM | POA: Diagnosis not present

## 2020-03-30 DIAGNOSIS — O8612 Endometritis following delivery: Secondary | ICD-10-CM | POA: Diagnosis not present

## 2020-03-30 DIAGNOSIS — F53 Postpartum depression: Secondary | ICD-10-CM | POA: Diagnosis not present

## 2020-03-30 DIAGNOSIS — O99345 Other mental disorders complicating the puerperium: Secondary | ICD-10-CM | POA: Diagnosis not present

## 2020-03-31 DIAGNOSIS — O8612 Endometritis following delivery: Secondary | ICD-10-CM | POA: Diagnosis not present

## 2020-04-01 DIAGNOSIS — R1030 Lower abdominal pain, unspecified: Secondary | ICD-10-CM | POA: Diagnosis not present

## 2020-04-01 DIAGNOSIS — O8612 Endometritis following delivery: Secondary | ICD-10-CM | POA: Diagnosis not present

## 2020-04-07 DIAGNOSIS — T8149XA Infection following a procedure, other surgical site, initial encounter: Secondary | ICD-10-CM | POA: Diagnosis not present

## 2020-04-08 DIAGNOSIS — Z9911 Dependence on respirator [ventilator] status: Secondary | ICD-10-CM | POA: Diagnosis not present

## 2020-04-08 DIAGNOSIS — R918 Other nonspecific abnormal finding of lung field: Secondary | ICD-10-CM | POA: Diagnosis not present

## 2020-04-08 DIAGNOSIS — Z9981 Dependence on supplemental oxygen: Secondary | ICD-10-CM | POA: Diagnosis not present

## 2020-04-12 DIAGNOSIS — K5730 Diverticulosis of large intestine without perforation or abscess without bleeding: Secondary | ICD-10-CM | POA: Diagnosis not present

## 2020-04-12 DIAGNOSIS — R509 Fever, unspecified: Secondary | ICD-10-CM | POA: Diagnosis not present

## 2020-04-12 DIAGNOSIS — R109 Unspecified abdominal pain: Secondary | ICD-10-CM | POA: Diagnosis not present

## 2020-04-12 DIAGNOSIS — T8149XA Infection following a procedure, other surgical site, initial encounter: Secondary | ICD-10-CM | POA: Diagnosis not present

## 2020-04-12 DIAGNOSIS — Z9049 Acquired absence of other specified parts of digestive tract: Secondary | ICD-10-CM | POA: Diagnosis not present

## 2020-04-12 DIAGNOSIS — Z4682 Encounter for fitting and adjustment of non-vascular catheter: Secondary | ICD-10-CM | POA: Diagnosis not present

## 2020-04-13 DIAGNOSIS — Z98891 History of uterine scar from previous surgery: Secondary | ICD-10-CM | POA: Diagnosis not present

## 2020-04-13 DIAGNOSIS — T8149XA Infection following a procedure, other surgical site, initial encounter: Secondary | ICD-10-CM | POA: Diagnosis not present

## 2020-04-14 ENCOUNTER — Ambulatory Visit: Payer: BC Managed Care – PPO | Admitting: Internal Medicine

## 2020-04-18 DIAGNOSIS — R0902 Hypoxemia: Secondary | ICD-10-CM | POA: Diagnosis not present

## 2020-04-18 DIAGNOSIS — R918 Other nonspecific abnormal finding of lung field: Secondary | ICD-10-CM | POA: Diagnosis not present

## 2020-04-22 DIAGNOSIS — F4323 Adjustment disorder with mixed anxiety and depressed mood: Secondary | ICD-10-CM | POA: Diagnosis not present

## 2020-04-28 ENCOUNTER — Encounter (HOSPITAL_COMMUNITY): Payer: Self-pay

## 2020-05-12 ENCOUNTER — Ambulatory Visit (INDEPENDENT_AMBULATORY_CARE_PROVIDER_SITE_OTHER): Payer: BC Managed Care – PPO | Admitting: Internal Medicine

## 2020-05-12 ENCOUNTER — Other Ambulatory Visit: Payer: Self-pay

## 2020-05-12 ENCOUNTER — Encounter: Payer: Self-pay | Admitting: Internal Medicine

## 2020-05-12 VITALS — BP 128/78 | HR 81 | Ht 66.0 in | Wt 227.2 lb

## 2020-05-12 DIAGNOSIS — E039 Hypothyroidism, unspecified: Secondary | ICD-10-CM | POA: Diagnosis not present

## 2020-05-12 LAB — T4, FREE: Free T4: 1.27 ng/dL (ref 0.60–1.60)

## 2020-05-12 LAB — TSH: TSH: 0.02 u[IU]/mL — ABNORMAL LOW (ref 0.35–4.50)
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
## 2020-05-12 NOTE — Patient Instructions (Signed)

## 2020-05-13 MED ORDER — LEVOTHYROXINE SODIUM 137 MCG PO TABS: 137.0000 ug | ORAL_TABLET | Freq: Every day | ORAL | 6 refills | Status: DC

## 2020-05-17 ENCOUNTER — Other Ambulatory Visit: Payer: Self-pay | Admitting: Internal Medicine

## 2020-05-25 DIAGNOSIS — Z1339 Encounter for screening examination for other mental health and behavioral disorders: Secondary | ICD-10-CM | POA: Diagnosis not present

## 2020-05-25 DIAGNOSIS — F411 Generalized anxiety disorder: Secondary | ICD-10-CM | POA: Diagnosis not present

## 2020-06-01 DIAGNOSIS — F411 Generalized anxiety disorder: Secondary | ICD-10-CM | POA: Diagnosis not present

## 2020-06-02 DIAGNOSIS — F332 Major depressive disorder, recurrent severe without psychotic features: Secondary | ICD-10-CM | POA: Diagnosis not present

## 2020-06-02 DIAGNOSIS — F411 Generalized anxiety disorder: Secondary | ICD-10-CM | POA: Diagnosis not present

## 2020-06-08 DIAGNOSIS — F332 Major depressive disorder, recurrent severe without psychotic features: Secondary | ICD-10-CM | POA: Diagnosis not present

## 2020-06-08 DIAGNOSIS — F411 Generalized anxiety disorder: Secondary | ICD-10-CM | POA: Diagnosis not present

## 2020-06-13 DIAGNOSIS — J029 Acute pharyngitis, unspecified: Secondary | ICD-10-CM | POA: Diagnosis not present

## 2020-06-13 DIAGNOSIS — D500 Iron deficiency anemia secondary to blood loss (chronic): Secondary | ICD-10-CM | POA: Diagnosis not present

## 2020-06-23 DIAGNOSIS — Z1159 Encounter for screening for other viral diseases: Secondary | ICD-10-CM | POA: Diagnosis not present

## 2020-07-01 DIAGNOSIS — F411 Generalized anxiety disorder: Secondary | ICD-10-CM | POA: Diagnosis not present

## 2020-07-06 NOTE — Addendum Note (Signed)
Addended by: Ashleigh Arya A on: 07/06/2020 11:44 AM   Modules accepted: Orders  

## 2020-07-07 ENCOUNTER — Other Ambulatory Visit: Payer: BC Managed Care – PPO

## 2020-07-28 DIAGNOSIS — F332 Major depressive disorder, recurrent severe without psychotic features: Secondary | ICD-10-CM | POA: Diagnosis not present

## 2020-07-28 DIAGNOSIS — F411 Generalized anxiety disorder: Secondary | ICD-10-CM | POA: Diagnosis not present

## 2020-08-25 DIAGNOSIS — F411 Generalized anxiety disorder: Secondary | ICD-10-CM | POA: Diagnosis not present

## 2020-08-25 DIAGNOSIS — F332 Major depressive disorder, recurrent severe without psychotic features: Secondary | ICD-10-CM | POA: Diagnosis not present

## 2020-09-01 ENCOUNTER — Other Ambulatory Visit (INDEPENDENT_AMBULATORY_CARE_PROVIDER_SITE_OTHER): Payer: BC Managed Care – PPO

## 2020-09-01 ENCOUNTER — Other Ambulatory Visit: Payer: Self-pay

## 2020-09-01 DIAGNOSIS — E039 Hypothyroidism, unspecified: Secondary | ICD-10-CM | POA: Diagnosis not present

## 2020-09-01 LAB — TSH: TSH: 13.86 mIU/L — ABNORMAL HIGH

## 2020-09-01 LAB — T4, FREE: Free T4: 0.8 ng/dL (ref 0.8–1.8)

## 2020-09-14 ENCOUNTER — Ambulatory Visit: Payer: BC Managed Care – PPO | Admitting: Internal Medicine
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
## 2021-02-15 ENCOUNTER — Ambulatory Visit: Payer: Managed Care, Other (non HMO) | Admitting: Internal Medicine

## 2021-02-15 ENCOUNTER — Encounter: Payer: Self-pay | Admitting: Internal Medicine

## 2021-02-15 ENCOUNTER — Other Ambulatory Visit: Payer: Self-pay

## 2021-02-15 VITALS — BP 122/76 | HR 88 | Ht 66.0 in | Wt 274.0 lb

## 2021-02-15 DIAGNOSIS — E039 Hypothyroidism, unspecified: Secondary | ICD-10-CM | POA: Diagnosis not present

## 2021-02-15 LAB — TSH: TSH: 1.34 u[IU]/mL (ref 0.35–4.50)
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
## 2021-04-12 ENCOUNTER — Other Ambulatory Visit (INDEPENDENT_AMBULATORY_CARE_PROVIDER_SITE_OTHER): Payer: Managed Care, Other (non HMO)

## 2021-04-12 ENCOUNTER — Other Ambulatory Visit: Payer: Self-pay

## 2021-04-12 DIAGNOSIS — E039 Hypothyroidism, unspecified: Secondary | ICD-10-CM | POA: Diagnosis not present

## 2021-04-12 LAB — TSH: TSH: 3.29 u[IU]/mL (ref 0.35–4.50)

## 2021-04-27 ENCOUNTER — Encounter (HOSPITAL_COMMUNITY): Payer: Self-pay | Admitting: *Deleted

## 2021-06-03 ENCOUNTER — Other Ambulatory Visit: Payer: Self-pay | Admitting: Internal Medicine

## 2021-06-21 ENCOUNTER — Encounter: Payer: Self-pay | Admitting: Internal Medicine

## 2021-06-21 ENCOUNTER — Ambulatory Visit: Payer: Managed Care, Other (non HMO) | Admitting: Internal Medicine

## 2021-06-21 ENCOUNTER — Other Ambulatory Visit: Payer: Self-pay

## 2021-06-21 VITALS — BP 124/80 | HR 103 | Ht 66.0 in | Wt 299.8 lb

## 2021-06-21 DIAGNOSIS — E039 Hypothyroidism, unspecified: Secondary | ICD-10-CM | POA: Diagnosis not present

## 2021-06-21 DIAGNOSIS — R635 Abnormal weight gain: Secondary | ICD-10-CM | POA: Diagnosis not present

## 2021-06-21 DIAGNOSIS — R5383 Other fatigue: Secondary | ICD-10-CM | POA: Diagnosis not present

## 2021-06-21 MED ORDER — DEXAMETHASONE 1 MG PO TABS: 1.0000 mg | ORAL_TABLET | Freq: Once | ORAL | 0 refills | Status: DC
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
## 2021-06-21 NOTE — Patient Instructions (Signed)
   Instructions for Dexamethasone Suppression Test   Step 1: Choose a morning when you can come to our lab at 8:00 am for a blood draw.   Step 2: On the night before the blood draw, take one 1 mg tablet of dexamethasone at 11:30 pm.  The timing is VERY important!   Step 3: The next morning, go to the lab for blood work at 8:00 am.  You do not have to be on an empty stomach, but the timing is VERY important!  

## 2021-06-22 ENCOUNTER — Encounter: Payer: Self-pay | Admitting: Internal Medicine

## 2021-06-22 LAB — COMPREHENSIVE METABOLIC PANEL
AG Ratio: 1.3 (calc) (ref 1.0–2.5)
ALT: 10 U/L (ref 6–29)
AST: 13 U/L (ref 10–30)
Albumin: 3.8 g/dL (ref 3.6–5.1)
Alkaline phosphatase (APISO): 91 U/L (ref 31–125)
BUN: 14 mg/dL (ref 7–25)
CO2: 25 mmol/L (ref 20–32)
Calcium: 8.7 mg/dL (ref 8.6–10.2)
Chloride: 104 mmol/L (ref 98–110)
Creat: 0.79 mg/dL (ref 0.50–0.97)
Globulin: 2.9 g/dL (calc) (ref 1.9–3.7)
Glucose, Bld: 95 mg/dL (ref 65–99)
Potassium: 4.2 mmol/L (ref 3.5–5.3)
Sodium: 139 mmol/L (ref 135–146)
Total Bilirubin: 0.3 mg/dL (ref 0.2–1.2)
Total Protein: 6.7 g/dL (ref 6.1–8.1)

## 2021-06-22 LAB — CBC
HCT: 34.7 % — ABNORMAL LOW (ref 35.0–45.0)
Hemoglobin: 10.9 g/dL — ABNORMAL LOW (ref 11.7–15.5)
MCH: 23.1 pg — ABNORMAL LOW (ref 27.0–33.0)
MCHC: 31.4 g/dL — ABNORMAL LOW (ref 32.0–36.0)
MCV: 73.7 fL — ABNORMAL LOW (ref 80.0–100.0)
MPV: 10.2 fL (ref 7.5–12.5)
Platelets: 370 10*3/uL (ref 140–400)
RBC: 4.71 10*6/uL (ref 3.80–5.10)
RDW: 16 % — ABNORMAL HIGH (ref 11.0–15.0)
WBC: 8.3 10*3/uL (ref 3.8–10.8)

## 2021-06-22 LAB — HEMOGLOBIN A1C
Hgb A1c MFr Bld: 5.3 % of total Hgb (ref <5.7)
Mean Plasma Glucose: 105 mg/dL
eAG (mmol/L): 5.8 mmol/L

## 2021-06-22 LAB — TSH: TSH: 5.28 mIU/L — ABNORMAL HIGH

## 2021-06-22 LAB — VITAMIN D 25 HYDROXY (VIT D DEFICIENCY, FRACTURES): Vit D, 25-Hydroxy: 17 ng/mL — ABNORMAL LOW (ref 30–100)

## 2021-06-22 MED ORDER — VITAMIN D (ERGOCALCIFEROL) 1.25 MG (50000 UNIT) PO CAPS: 50000.0000 [IU] | ORAL_CAPSULE | ORAL | 0 refills | Status: AC

## 2021-06-22 MED ORDER — LEVOTHYROXINE SODIUM 150 MCG PO TABS: 150.0000 ug | ORAL_TABLET | Freq: Every day | ORAL | 1 refills | Status: DC

## 2021-06-22 MED ORDER — IRON (FERROUS SULFATE) 325 (65 FE) MG PO TABS: 325.0000 mg | ORAL_TABLET | Freq: Every day | ORAL | 1 refills | Status: AC

## 2021-06-23 ENCOUNTER — Other Ambulatory Visit: Payer: Managed Care, Other (non HMO)

## 2021-06-26 ENCOUNTER — Encounter: Payer: Self-pay | Admitting: Internal Medicine

## 2021-06-26 MED ORDER — DEXAMETHASONE 1 MG PO TABS: 1.0000 mg | ORAL_TABLET | Freq: Once | ORAL | 0 refills | Status: AC

## 2021-07-05 ENCOUNTER — Other Ambulatory Visit: Payer: Self-pay | Admitting: Internal Medicine

## 2021-07-05 DIAGNOSIS — E559 Vitamin D deficiency, unspecified: Secondary | ICD-10-CM | POA: Insufficient documentation

## 2021-12-15 DIAGNOSIS — N809 Endometriosis, unspecified: Secondary | ICD-10-CM | POA: Insufficient documentation

## 2021-12-27 ENCOUNTER — Ambulatory Visit: Payer: Managed Care, Other (non HMO) | Admitting: Internal Medicine

## 2022-01-03 ENCOUNTER — Other Ambulatory Visit: Payer: Self-pay | Admitting: Internal Medicine

## 2022-02-01 ENCOUNTER — Telehealth: Payer: Self-pay | Admitting: Family Medicine

## 2022-02-01 NOTE — Telephone Encounter (Signed)
Called patient in regards to receiving more information before her appointment on 4/4, there was no answer to the phone call so a voicemail was left with a detailed message and a mychart message was sent.  ?

## 2022-02-07 ENCOUNTER — Encounter: Payer: Self-pay | Admitting: Obstetrics and Gynecology

## 2022-04-26 ENCOUNTER — Encounter (HOSPITAL_COMMUNITY): Payer: Self-pay | Admitting: *Deleted

## 2022-06-12 ENCOUNTER — Other Ambulatory Visit: Payer: Self-pay | Admitting: Internal Medicine

## 2022-10-12 ENCOUNTER — Other Ambulatory Visit: Payer: Self-pay | Admitting: Internal Medicine

## 2022-11-15 ENCOUNTER — Other Ambulatory Visit: Payer: Self-pay | Admitting: Internal Medicine

## 2023-05-22 ENCOUNTER — Encounter (HOSPITAL_COMMUNITY): Payer: Self-pay | Admitting: *Deleted

## 2023-07-17 LAB — BASIC METABOLIC PANEL
BUN: 9 (ref 4–21)
CO2: 20 (ref 13–22)
Chloride: 106 (ref 99–108)
Creatinine: 0.9 (ref 0.5–1.1)
Glucose: 92
Potassium: 4.2 mEq/L (ref 3.5–5.1)
Sodium: 143 (ref 137–147)

## 2023-07-17 LAB — CBC AND DIFFERENTIAL
HCT: 40 (ref 36–46)
Hemoglobin: 11.5 — AB (ref 12.0–16.0)
Platelets: 391 10*3/uL (ref 150–400)
WBC: 7.9

## 2023-07-17 LAB — LIPID PANEL
Cholesterol: 210 — AB (ref 0–200)
HDL: 64 (ref 35–70)
LDL Cholesterol: 105
LDl/HDL Ratio: 1.6
Triglycerides: 203 — AB (ref 40–160)

## 2023-07-17 LAB — HEPATIC FUNCTION PANEL
ALT: 21 U/L (ref 7–35)
AST: 21 (ref 13–35)
Alkaline Phosphatase: 82 (ref 25–125)
Bilirubin, Total: 0.7

## 2023-07-17 LAB — COMPREHENSIVE METABOLIC PANEL
Albumin: 3.8 (ref 3.5–5.0)
Calcium: 8.6 — AB (ref 8.7–10.7)
Globulin: 2.8
eGFR: 83

## 2023-07-17 LAB — TSH: TSH: 3.27 (ref 0.41–5.90)

## 2023-07-17 LAB — CBC: RBC: 5 (ref 3.87–5.11)

## 2023-07-17 LAB — VITAMIN D 25 HYDROXY (VIT D DEFICIENCY, FRACTURES): Vit D, 25-Hydroxy: 26.1

## 2023-07-17 LAB — HEMOGLOBIN A1C: Hemoglobin A1C: 5.6

## 2023-07-17 LAB — VITAMIN B12: Vitamin B-12: 124
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
## 2023-07-30 ENCOUNTER — Encounter: Payer: Self-pay | Admitting: Physician Assistant

## 2023-07-30 ENCOUNTER — Ambulatory Visit: Payer: Managed Care, Other (non HMO) | Admitting: Physician Assistant

## 2023-07-30 VITALS — BP 132/86 | HR 75 | Temp 98.0°F | Ht 67.75 in | Wt 286.1 lb

## 2023-07-30 DIAGNOSIS — R112 Nausea with vomiting, unspecified: Secondary | ICD-10-CM | POA: Diagnosis not present

## 2023-07-30 DIAGNOSIS — E538 Deficiency of other specified B group vitamins: Secondary | ICD-10-CM | POA: Diagnosis not present

## 2023-07-30 DIAGNOSIS — E611 Iron deficiency: Secondary | ICD-10-CM

## 2023-07-30 DIAGNOSIS — E559 Vitamin D deficiency, unspecified: Secondary | ICD-10-CM

## 2023-07-30 DIAGNOSIS — E039 Hypothyroidism, unspecified: Secondary | ICD-10-CM

## 2023-07-30 DIAGNOSIS — Z9884 Bariatric surgery status: Secondary | ICD-10-CM

## 2023-07-30 DIAGNOSIS — Z309 Encounter for contraceptive management, unspecified: Secondary | ICD-10-CM | POA: Diagnosis not present

## 2023-07-30 DIAGNOSIS — Z23 Encounter for immunization: Secondary | ICD-10-CM

## 2023-07-30 MED ORDER — CYANOCOBALAMIN 1000 MCG/ML IJ SOLN
INTRAMUSCULAR | 0 refills | Status: AC
Start: 2023-07-30 — End: ?

## 2023-07-30 MED ORDER — WEGOVY 0.25 MG/0.5ML ~~LOC~~ SOAJ: 0.2500 mg | SUBCUTANEOUS | 0 refills | Status: DC

## 2023-07-30 MED ORDER — "SYRINGE LUER LOCK 25G X 1"" 3 ML MISC": 1 refills | Status: AC

## 2023-07-30 MED ORDER — LO LOESTRIN FE 1 MG-10 MCG / 10 MCG PO TABS: 1.0000 | ORAL_TABLET | Freq: Every day | ORAL | 11 refills | Status: AC

## 2023-07-30 NOTE — Assessment & Plan Note (Signed)
-  Discontinue NuvaRing and switch back to [employer] -monitor for n/v changes

## 2023-07-30 NOTE — Assessment & Plan Note (Signed)
-  Start vitamin D supplementation at 5000 units.

## 2023-07-30 NOTE — Assessment & Plan Note (Signed)
Currently managed with continuous NuvaRing. Pt would like to avoid menstrual cycles d/t painful periods and cysts.  However, experiencing severe nausea and vomiting, possibly related to hormonal changes from NuvaRing and Lamictal interaction. Pt recalls no side effects on lo losetrin and would like to switch back.  -Remove NuvaRing on Thursday (end of week 3) wait for bleeding, and start Loloestrin on the following Sunday.

## 2023-07-30 NOTE — Assessment & Plan Note (Signed)
-  Start vitamin B12 injections once a week for four weeks, then monthly. Pt has history of IVF and is comfortable w/ self injecting at home

## 2023-07-30 NOTE — Assessment & Plan Note (Addendum)
History of iron deficiency, secondary to blood loss during childbirth per pt. Not currently taking iron supplements regularly. No current bleeding or menstruation as pt is using nuvaring continuously. -Not anemic on labs, but close, and MCV low.  -Resume regular iron supplementation.

## 2023-07-30 NOTE — Assessment & Plan Note (Signed)
-  Attempt to get Wegovy covered by insurance for weight management. explained moa, se. explained that d/t her history of gastric surgery, may experience more severe SE

## 2023-07-30 NOTE — Assessment & Plan Note (Signed)
Currently managed with of synthroid -Continue  -reviewed tsh

## 2023-07-31 ENCOUNTER — Encounter: Payer: Self-pay | Admitting: Physician Assistant

## 2023-07-31 ENCOUNTER — Emergency Department (HOSPITAL_BASED_OUTPATIENT_CLINIC_OR_DEPARTMENT_OTHER)
Admission: EM | Admit: 2023-07-31 | Discharge: 2023-07-31 | Disposition: A | Discharge status: Home / Self Care | Payer: Managed Care, Other (non HMO) | Attending: Emergency Medicine | Admitting: Emergency Medicine

## 2023-07-31 ENCOUNTER — Encounter (HOSPITAL_BASED_OUTPATIENT_CLINIC_OR_DEPARTMENT_OTHER): Payer: Self-pay | Admitting: Radiology

## 2023-07-31 ENCOUNTER — Other Ambulatory Visit: Payer: Self-pay

## 2023-07-31 ENCOUNTER — Emergency Department (HOSPITAL_BASED_OUTPATIENT_CLINIC_OR_DEPARTMENT_OTHER): Payer: Managed Care, Other (non HMO)

## 2023-07-31 DIAGNOSIS — K5732 Diverticulitis of large intestine without perforation or abscess without bleeding: Secondary | ICD-10-CM | POA: Diagnosis not present

## 2023-07-31 DIAGNOSIS — R1084 Generalized abdominal pain: Secondary | ICD-10-CM

## 2023-07-31 DIAGNOSIS — K529 Noninfective gastroenteritis and colitis, unspecified: Secondary | ICD-10-CM

## 2023-07-31 DIAGNOSIS — Z23 Encounter for immunization: Secondary | ICD-10-CM | POA: Diagnosis not present

## 2023-07-31 DIAGNOSIS — R109 Unspecified abdominal pain: Secondary | ICD-10-CM | POA: Diagnosis present

## 2023-07-31 LAB — COMPREHENSIVE METABOLIC PANEL
ALT: 40 U/L (ref 0–44)
AST: 32 U/L (ref 15–41)
Albumin: 4 g/dL (ref 3.5–5.0)
Alkaline Phosphatase: 69 U/L (ref 38–126)
Anion gap: 11 (ref 5–15)
BUN: 5 mg/dL — ABNORMAL LOW (ref 6–20)
CO2: 24 mmol/L (ref 22–32)
Calcium: 8.7 mg/dL — ABNORMAL LOW (ref 8.9–10.3)
Chloride: 103 mmol/L (ref 98–111)
Creatinine, Ser: 0.8 mg/dL (ref 0.44–1.00)
GFR, Estimated: >60 mL/min (ref >60)
Glucose, Bld: 144 mg/dL — ABNORMAL HIGH (ref 70–99)
Potassium: 3.2 mmol/L — ABNORMAL LOW (ref 3.5–5.1)
Sodium: 138 mmol/L (ref 135–145)
Total Bilirubin: 0.4 mg/dL (ref 0.3–1.2)
Total Protein: 7 g/dL (ref 6.5–8.1)

## 2023-07-31 LAB — URINALYSIS, ROUTINE W REFLEX MICROSCOPIC
Bilirubin Urine: NEGATIVE
Glucose, UA: NEGATIVE mg/dL
Hgb urine dipstick: NEGATIVE
Ketones, ur: NEGATIVE mg/dL
Leukocytes,Ua: NEGATIVE
Nitrite: NEGATIVE
Protein, ur: NEGATIVE mg/dL
Specific Gravity, Urine: <=1.005 (ref 1.005–1.030)
pH: 5.5 (ref 5.0–8.0)

## 2023-07-31 LAB — CBC
HCT: 38.1 % (ref 36.0–46.0)
Hemoglobin: 11.9 g/dL — ABNORMAL LOW (ref 12.0–15.0)
MCH: 23 pg — ABNORMAL LOW (ref 26.0–34.0)
MCHC: 31.2 g/dL (ref 30.0–36.0)
MCV: 73.6 fL — ABNORMAL LOW (ref 80.0–100.0)
Platelets: 397 10*3/uL (ref 150–400)
RBC: 5.18 MIL/uL — ABNORMAL HIGH (ref 3.87–5.11)
RDW: 15.9 % — ABNORMAL HIGH (ref 11.5–15.5)
WBC: 8.7 10*3/uL (ref 4.0–10.5)
nRBC: 0 % (ref 0.0–0.2)

## 2023-07-31 LAB — LIPASE, BLOOD: Lipase: 10 U/L — ABNORMAL LOW (ref 11–51)

## 2023-07-31 LAB — PREGNANCY, URINE: Preg Test, Ur: NEGATIVE

## 2023-07-31 MED ORDER — LACTATED RINGERS IV BOLUS
1000.0000 mL | Freq: Once | INTRAVENOUS | Status: AC
  Administered 2023-07-31: 1000 mL via INTRAVENOUS

## 2023-07-31 MED ORDER — PROMETHAZINE HCL 25 MG RE SUPP
25.0000 mg | Freq: Four times a day (QID) | RECTAL | 0 refills | Status: AC | PRN
Start: 2023-07-31 — End: ?

## 2023-07-31 MED ORDER — HYDROMORPHONE HCL 1 MG/ML IJ SOLN
1.0000 mg | Freq: Once | INTRAMUSCULAR | Status: AC
  Administered 2023-07-31: 1 mg via INTRAVENOUS
  Filled 2023-07-31: qty 1

## 2023-07-31 MED ORDER — METOCLOPRAMIDE HCL 5 MG/ML IJ SOLN
10.0000 mg | Freq: Once | INTRAMUSCULAR | Status: AC
  Administered 2023-07-31: 10 mg via INTRAVENOUS
  Filled 2023-07-31: qty 2

## 2023-07-31 MED ORDER — PROMETHAZINE (PHENERGAN) 6.25MG IN NS 50ML IVPB
6.2500 mg | Freq: Four times a day (QID) | INTRAVENOUS | Status: DC | PRN
  Administered 2023-07-31: 6.25 mg via INTRAVENOUS
  Filled 2023-07-31: qty 50

## 2023-07-31 MED ORDER — SODIUM CHLORIDE 0.9 % IV BOLUS
500.0000 mL | Freq: Once | INTRAVENOUS | Status: AC
  Administered 2023-07-31: 500 mL via INTRAVENOUS

## 2023-07-31 MED ORDER — ONDANSETRON 4 MG PO TBDP: 4.0000 mg | ORAL_TABLET | Freq: Three times a day (TID) | ORAL | 0 refills | Status: AC | PRN

## 2023-07-31 MED ORDER — IOHEXOL 300 MG/ML  SOLN
100.0000 mL | Freq: Once | INTRAMUSCULAR | Status: AC | PRN
  Administered 2023-07-31: 100 mL via INTRAVENOUS

## 2023-07-31 MED ORDER — MORPHINE SULFATE (PF) 4 MG/ML IV SOLN
4.0000 mg | Freq: Once | INTRAVENOUS | Status: AC
  Administered 2023-07-31: 4 mg via INTRAVENOUS
  Filled 2023-07-31: qty 1

## 2023-07-31 MED ORDER — ONDANSETRON HCL 4 MG/2ML IJ SOLN
4.0000 mg | Freq: Once | INTRAMUSCULAR | Status: AC
  Administered 2023-07-31: 4 mg via INTRAVENOUS
  Filled 2023-07-31: qty 2

## 2023-07-31 MED ORDER — DIPHENHYDRAMINE HCL 50 MG/ML IJ SOLN
12.5000 mg | Freq: Once | INTRAMUSCULAR | Status: AC
  Administered 2023-07-31: 12.5 mg via INTRAVENOUS
  Filled 2023-07-31: qty 1

## 2023-07-31 MED ORDER — LACTATED RINGERS IV BOLUS: 500.0000 mL | Freq: Once | INTRAVENOUS | Status: DC

## 2023-07-31 MED ORDER — PROMETHAZINE HCL 25 MG/ML IJ SOLN
INTRAMUSCULAR | Status: AC
  Filled 2023-07-31: qty 1

## 2023-07-31 MED ORDER — HYDROCODONE-ACETAMINOPHEN 5-325 MG PO TABS
1.0000 | ORAL_TABLET | Freq: Four times a day (QID) | ORAL | 0 refills | Status: AC | PRN
Start: 2023-07-31 — End: ?

## 2023-07-31 MED ORDER — SODIUM CHLORIDE 0.9 % IV SOLN: INTRAVENOUS | Status: DC | PRN

## 2023-07-31 MED ORDER — POTASSIUM CHLORIDE 10 MEQ/100ML IV SOLN
10.0000 meq | INTRAVENOUS | Status: AC
  Administered 2023-07-31 (×3): 10 meq via INTRAVENOUS
  Filled 2023-07-31: qty 100

## 2023-07-31 MED ORDER — AMOXICILLIN-POT CLAVULANATE 875-125 MG PO TABS
1.0000 | ORAL_TABLET | Freq: Two times a day (BID) | ORAL | 0 refills | Status: AC
Start: 2023-07-31 — End: ?

## 2023-07-31 NOTE — ED Notes (Signed)
Patient transported to CT 

## 2023-07-31 NOTE — ED Triage Notes (Signed)
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 Reports nuvaring was newly in placed prior to onset of symptoms and this was removed earlier today. Stated she had about 6 episodes of amber colored diarrhea today.

## 2023-07-31 NOTE — ED Provider Notes (Signed)
EMERGENCY DEPARTMENT AT [facility] Provider Note   CSN: [id number] Arrival date & time: 07/31/23  1118     History  Chief Complaint  Patient presents with   Abdominal Pain    Nicole Reed is a 37 y.o. female.  37 year old female presents today for evaluation of 2-week duration of [name] COVID.  She states this worsened in the past couple days.  She does have history of gastric sleeve.  No hematemesis, blood in stool, or other concerning symptoms.  Pain does not radiate anywhere.  She was seen by PCP.  She states her thought was maybe her NuvaRing was causing her symptoms as she has had nausea and vomiting from following medications in the past.  The history is provided by the patient. No language interpreter was used.       Home Medications Prior to Admission medications   Medication Sig Start Date End Date Taking? Authorizing Provider  acetaminophen (TYLENOL) 500 MG tablet Take 1,000 mg by mouth every 8 (eight) hours as needed for mild pain or headache.    [provider]  albuterol (PROVENTIL HFA;VENTOLIN HFA) 108 (90 Base) MCG/ACT inhaler Inhale 2 puffs into the lungs every 6 (six) hours as needed for wheezing or shortness of breath.    [provider]  ALPRAZolam Prudy Feeler) 0.25 MG tablet Take 0.25-0.5 mg by mouth as needed. 04/12/23   [provider]  cyanocobalamin (VITAMIN B12) 1000 MCG/ML injection Inject 1 mL into the muscle weekly for four weeks, and then once a month. 07/30/23   Alfredia Ferguson, PA-C  Dextromethorphan-buPROPion ER (AUVELITY) 45-105 MG TBCR Take by mouth daily.    [provider]  Iron, Ferrous Sulfate, 325 (65 Fe) MG TABS Take 325 mg by mouth daily. 06/22/21   Shamleffer, Konrad Dolores, MD  lamoTRIgine (LAMICTAL) 100 MG tablet Take 100 mg by mouth daily.    [provider]  Norethindrone-Ethinyl Estradiol-Fe Biphas (LO LOESTRIN FE) 1 MG-10 MCG / 10 MCG tablet Take 1 tablet by mouth  daily. 07/30/23   Alfredia Ferguson, PA-C  Semaglutide-Weight Management (WEGOVY) 0.25 MG/0.5ML SOAJ Inject 0.25 mg into the skin once a week. For four weeks. Subcutaneous injection, inject into stomach 07/30/23   Drubel, Lillia Abed, PA-C  Syringe/Needle, Disp, (SYRINGE LUER LOCK) 25G X 1" 3 ML MISC Use for vitamin B12 injections. Inject 1 mL a week for four weeks, and then one injection monthly 07/30/23   Drubel, Lillia Abed, PA-C  Vitamin D, Ergocalciferol, (DRISDOL) 1.25 MG (50000 UNIT) CAPS capsule Take 1 capsule (50,000 Units total) by mouth every 7 (seven) days. 06/22/21   Shamleffer, Konrad Dolores, MD      Allergies    Guaifenesin er, Nsaids, and Codeine    Review of Systems   Review of Systems  Constitutional:  Negative for chills and fever.  Gastrointestinal:  Positive for abdominal pain, nausea and vomiting. Negative for constipation.  Genitourinary:  Negative for dysuria.  Neurological:  Negative for light-headedness.  All other systems reviewed and are negative.   Physical Exam Updated Vital Signs BP (!) 155/80   Pulse 71   Temp 98.4 F (36.9 C)   Resp 12   Ht 5' 7.75" (1.721 m)   Wt 129.8 kg   SpO2 100%   BMI 43.83 kg/m  Physical Exam Vitals and nursing note reviewed.  Constitutional:      General: She is not in acute distress.    Appearance: Normal appearance. She is not ill-appearing.  HENT:  Head: Normocephalic and atraumatic.     Nose: Nose normal.  Eyes:     Conjunctiva/sclera: Conjunctivae normal.  Cardiovascular:     Rate and Rhythm: Normal rate and regular rhythm.  Pulmonary:     Effort: Pulmonary effort is normal. No respiratory distress.  Abdominal:     General: There is no distension.     Palpations: Abdomen is soft.     Tenderness: There is abdominal tenderness. There is no guarding.  Musculoskeletal:        General: No deformity. Normal range of motion.     Cervical back: Normal range of motion.  Skin:    Findings: No rash.  Neurological:      Mental Status: She is alert.     ED Results / Procedures / Treatments   Labs (all labs ordered are listed, but only abnormal results are displayed) Labs Reviewed  LIPASE, BLOOD - Abnormal; Notable for the following components:      Result Value   Lipase 10 (*)    All other components within normal limits  COMPREHENSIVE METABOLIC PANEL - Abnormal; Notable for the following components:   Potassium 3.2 (*)    Glucose, Bld 144 (*)    BUN 5 (*)    Calcium 8.7 (*)    All other components within normal limits  CBC - Abnormal; Notable for the following components:   RBC 5.18 (*)    Hemoglobin 11.9 (*)    MCV 73.6 (*)    MCH 23.0 (*)    RDW 15.9 (*)    All other components within normal limits  URINALYSIS, ROUTINE W REFLEX MICROSCOPIC  PREGNANCY, URINE    EKG None  Radiology CT ABDOMEN PELVIS W CONTRAST  Result Date: 07/31/2023 CLINICAL DATA:  Acute abdominal pain with vomiting and diarrhea. EXAM: CT ABDOMEN AND PELVIS WITH CONTRAST TECHNIQUE: Multidetector CT imaging of the abdomen and pelvis was performed using the standard protocol following bolus administration of intravenous contrast. RADIATION DOSE REDUCTION: This exam was performed according to the departmental dose-optimization program which includes automated exposure control, adjustment of the mA and/or kV according to patient size and/or use of iterative reconstruction technique. CONTRAST:  OMNIPAQUE IOHEXOL 300 MG/ML  SOLN COMPARISON:  CT abdomen and pelvis 02/21/2017 FINDINGS: Lower chest: No acute abnormality. Hepatobiliary: No focal liver abnormality is seen. Status post cholecystectomy. No biliary dilatation. Pancreas: Unremarkable. No pancreatic ductal dilatation or surrounding inflammatory changes. Spleen: Normal in size without focal abnormality. Adrenals/Urinary Tract: Adrenal glands are unremarkable. Kidneys are normal, without renal calculi, focal lesion, or hydronephrosis. Bladder is unremarkable. Stomach/Bowel:  There is mild wall thickening of the colon most significant in the ascending and transverse colon. There is trace surrounding inflammatory stranding. There is no bowel obstruction, pneumatosis or free air. There is sigmoid colon diverticulosis without evidence for diverticulitis. The appendix and small bowel are within normal limits. There surgical changes in the stomach, likely gastric bypass. Vascular/Lymphatic: No significant vascular findings are present. No enlarged abdominal or pelvic lymph nodes. Reproductive: There is an exophytic/pedunculated fibroid from the anterior left uterus measuring 2.9 2.0 cm which has slightly increased in size. Adnexa are within normal limits. Other: No abdominal wall hernia or abnormality. No abdominopelvic ascites. Musculoskeletal: No acute or significant osseous findings. IMPRESSION: 1. Findings compatible with nonspecific colitis, most significant in the ascending and transverse colon. 2. Sigmoid colon diverticulosis without evidence for diverticulitis. 3. Uterine fibroid. Electronically Signed   By: Darliss Cheney M.D.   On: 07/31/2023 15:55  Procedures Procedures    Medications Ordered in ED Medications  potassium chloride 10 mEq in 100 mL IVPB (0 mEq Intravenous Stopped 07/31/23 1600)  promethazine (PHENERGAN) 6.25 mg/NS 50 mL IVPB (0 mg Intravenous Stopped 07/31/23 1624)  0.9 %  sodium chloride infusion ( Intravenous Rate/Dose Change 07/31/23 1632)  ondansetron (ZOFRAN) injection 4 mg (4 mg Intravenous Given 07/31/23 1316)  morphine (PF) 4 MG/ML injection 4 mg (4 mg Intravenous Given 07/31/23 1316)  lactated ringers bolus 1,000 mL (0 mLs Intravenous Stopped 07/31/23 1452)  iohexol (OMNIPAQUE) 300 MG/ML solution 100 mL (100 mLs Intravenous Contrast Given 07/31/23 1324)  HYDROmorphone (DILAUDID) injection 1 mg (1 mg Intravenous Given 07/31/23 1456)  sodium chloride 0.9 % bolus 500 mL (0 mLs Intravenous Stopped 07/31/23 1530)  ondansetron (ZOFRAN) injection 4 mg (4  mg Intravenous Given 07/31/23 1530)  metoCLOPramide (REGLAN) injection 10 mg (10 mg Intravenous Given 07/31/23 1604)  diphenhydrAMINE (BENADRYL) injection 12.5 mg (12.5 mg Intravenous Given 07/31/23 1600)  promethazine (PHENERGAN) 25 MG/ML injection (  Return to [facility] 07/31/23 1609)    ED Course/ Medical Decision Making/ A&P                                 Medical Decision Making Amount and/or Complexity of Data Reviewed Labs: ordered. Radiology: ordered.  Risk Prescription drug management.   37 year old female presents today for evaluation of 2-week duration of abdominal pain.  Worsening the past couple days.  She does have history of gastric sleeve.  Recently seen by PCP for the same.  Has some adjustment to her hormonal medication without any improvement.  Will obtain labs, imaging, provide symptom control.  Potassium slightly low at 3.2.  Glucose 144.  CBC with hemoglobin of 11.9 which is around patient's baseline.  No leukocytosis.  Lipase within normal limit.  Pregnancy test negative.  UA unremarkable.  CT abdomen pelvis with evidence of colitis otherwise no acute process.  Patient after medications in the emergency department reports improvement of symptoms.  Will treat with Augmentin, provide Zofran, as well as pain medication.  GI referral given for as needed follow-up.  Patient and husband voiced understanding and are in agreement with the plan.   Final Clinical Impression(s) / ED Diagnoses Final diagnoses:  Generalized abdominal pain  Colitis    Rx / DC Orders ED Discharge Orders          Ordered    amoxicillin-clavulanate (AUGMENTIN) 875-125 MG tablet  Every 12 hours        07/31/23 1701    ondansetron (ZOFRAN-ODT) 4 MG disintegrating tablet  Every 8 hours PRN        07/31/23 1701    promethazine (PHENERGAN) 25 MG suppository  Every 6 hours PRN        07/31/23 1701    HYDROcodone-acetaminophen (NORCO/VICODIN) 5-325 MG tablet  Every 6 hours PRN        07/31/23 1701               Marita Kansas, PA-C 07/31/23 1726    Melene Plan, DO 08/02/23 [phone]

## 2023-07-31 NOTE — ED Notes (Signed)
Pt c/o N/V.  EDP notified.

## 2023-07-31 NOTE — Discharge Instructions (Addendum)
Your workup today showed colitis.  I sent antibiotic, pain medication, nausea medication into the pharmacy for you.  Did have improvement in your symptoms with medications in the emergency department.  For any concerning symptoms return to the emergency room.

## 2023-08-02 ENCOUNTER — Other Ambulatory Visit: Payer: Self-pay | Admitting: Physician Assistant

## 2023-08-02 DIAGNOSIS — K529 Noninfective gastroenteritis and colitis, unspecified: Secondary | ICD-10-CM

## 2023-08-02 DIAGNOSIS — R112 Nausea with vomiting, unspecified: Secondary | ICD-10-CM

## 2023-08-06 ENCOUNTER — Other Ambulatory Visit: Payer: Self-pay | Admitting: Physician Assistant

## 2023-08-06 DIAGNOSIS — K529 Noninfective gastroenteritis and colitis, unspecified: Secondary | ICD-10-CM

## 2023-08-06 DIAGNOSIS — E876 Hypokalemia: Secondary | ICD-10-CM

## 2023-08-07 ENCOUNTER — Other Ambulatory Visit (INDEPENDENT_AMBULATORY_CARE_PROVIDER_SITE_OTHER): Payer: Managed Care, Other (non HMO)

## 2023-08-07 DIAGNOSIS — K529 Noninfective gastroenteritis and colitis, unspecified: Secondary | ICD-10-CM | POA: Diagnosis not present

## 2023-08-07 DIAGNOSIS — E876 Hypokalemia: Secondary | ICD-10-CM

## 2023-08-07 LAB — CBC WITH DIFFERENTIAL/PLATELET
Basophils Absolute: 0.1 10*3/uL (ref 0.0–0.1)
Basophils Relative: 1.1 % (ref 0.0–3.0)
Eosinophils Absolute: 0.2 10*3/uL (ref 0.0–0.7)
Eosinophils Relative: 2 % (ref 0.0–5.0)
HCT: 42.3 % (ref 36.0–46.0)
Hemoglobin: 13.1 g/dL (ref 12.0–15.0)
Lymphocytes Relative: 22.7 % (ref 12.0–46.0)
Lymphs Abs: 1.9 10*3/uL (ref 0.7–4.0)
MCHC: 31 g/dL (ref 30.0–36.0)
MCV: 72 fL — ABNORMAL LOW (ref 78.0–100.0)
Monocytes Absolute: 0.7 10*3/uL (ref 0.1–1.0)
Monocytes Relative: 8.3 % (ref 3.0–12.0)
Neutro Abs: 5.4 10*3/uL (ref 1.4–7.7)
Neutrophils Relative %: 65.9 % (ref 43.0–77.0)
Platelets: 419 10*3/uL — ABNORMAL HIGH (ref 150.0–400.0)
RBC: 5.87 Mil/uL — ABNORMAL HIGH (ref 3.87–5.11)
RDW: 17.2 % — ABNORMAL HIGH (ref 11.5–15.5)
WBC: 8.3 10*3/uL (ref 4.0–10.5)

## 2023-08-07 LAB — COMPREHENSIVE METABOLIC PANEL
ALT: 69 U/L — ABNORMAL HIGH (ref 0–35)
AST: 59 U/L — ABNORMAL HIGH (ref 0–37)
Albumin: 4.2 g/dL (ref 3.5–5.2)
Alkaline Phosphatase: 68 U/L (ref 39–117)
BUN: 7 mg/dL (ref 6–23)
CO2: 22 meq/L (ref 19–32)
Calcium: 9.5 mg/dL (ref 8.4–10.5)
Chloride: 101 meq/L (ref 96–112)
Creatinine, Ser: 0.99 mg/dL (ref 0.40–1.20)
GFR: 71.89 mL/min (ref >60.00)
Glucose, Bld: 112 mg/dL — ABNORMAL HIGH (ref 70–99)
Potassium: 3.8 meq/L (ref 3.5–5.1)
Sodium: 137 meq/L (ref 135–145)
Total Bilirubin: 0.5 mg/dL (ref 0.2–1.2)
Total Protein: 7.7 g/dL (ref 6.0–8.3)

## 2023-08-29 ENCOUNTER — Ambulatory Visit: Payer: Managed Care, Other (non HMO) | Admitting: Physician Assistant

## 2023-09-14 ENCOUNTER — Other Ambulatory Visit: Payer: Self-pay | Admitting: Physician Assistant

## 2023-09-14 ENCOUNTER — Encounter: Payer: Self-pay | Admitting: Physician Assistant

## 2023-09-14 DIAGNOSIS — E039 Hypothyroidism, unspecified: Secondary | ICD-10-CM

## 2023-09-14 MED ORDER — LEVOTHYROXINE SODIUM 200 MCG PO TABS
200.0000 ug | ORAL_TABLET | Freq: Every day | ORAL | 3 refills | Status: DC
Start: 2023-09-14 — End: 2024-09-26

## 2023-10-21 ENCOUNTER — Other Ambulatory Visit: Payer: Self-pay | Admitting: Physician Assistant

## 2023-10-21 DIAGNOSIS — Z9884 Bariatric surgery status: Secondary | ICD-10-CM

## 2023-10-21 DIAGNOSIS — E538 Deficiency of other specified B group vitamins: Secondary | ICD-10-CM

## 2023-11-28 ENCOUNTER — Encounter: Payer: Self-pay | Admitting: Physician Assistant

## 2023-11-29 ENCOUNTER — Encounter: Payer: Self-pay | Admitting: Physician Assistant

## 2023-11-29 ENCOUNTER — Other Ambulatory Visit: Payer: Self-pay | Admitting: Physician Assistant

## 2023-11-29 MED ORDER — ZEPBOUND 2.5 MG/0.5ML ~~LOC~~ SOAJ: 2.5000 mg | SUBCUTANEOUS | 0 refills | Status: DC

## 2023-11-29 MED ORDER — ZEPBOUND 5 MG/0.5ML ~~LOC~~ SOAJ: 5.0000 mg | SUBCUTANEOUS | 2 refills | Status: DC

## 2023-11-29 NOTE — Telephone Encounter (Signed)
Started PA zepbound w/ [employer] insurance [id number]

## 2023-11-29 NOTE — Telephone Encounter (Signed)
Pt's [facility] has been added into her chart.

## 2024-02-01 ENCOUNTER — Other Ambulatory Visit (HOSPITAL_BASED_OUTPATIENT_CLINIC_OR_DEPARTMENT_OTHER): Payer: Self-pay | Admitting: Physician Assistant

## 2024-02-01 DIAGNOSIS — Z1231 Encounter for screening mammogram for malignant neoplasm of breast: Secondary | ICD-10-CM

## 2024-02-06 ENCOUNTER — Ambulatory Visit: Payer: Managed Care, Other (non HMO)

## 2024-02-06 DIAGNOSIS — Z1231 Encounter for screening mammogram for malignant neoplasm of breast: Secondary | ICD-10-CM

## 2024-02-11 ENCOUNTER — Encounter: Payer: Self-pay | Admitting: Physician Assistant

## 2024-03-02 ENCOUNTER — Encounter: Payer: Self-pay | Admitting: Physician Assistant

## 2024-03-21 ENCOUNTER — Other Ambulatory Visit: Payer: Self-pay | Admitting: Physician Assistant

## 2024-03-21 ENCOUNTER — Encounter: Payer: Self-pay | Admitting: Physician Assistant

## 2024-03-21 DIAGNOSIS — E039 Hypothyroidism, unspecified: Secondary | ICD-10-CM

## 2024-04-04 ENCOUNTER — Ambulatory Visit: Payer: Self-pay | Admitting: Physician Assistant

## 2024-04-04 ENCOUNTER — Other Ambulatory Visit (INDEPENDENT_AMBULATORY_CARE_PROVIDER_SITE_OTHER)

## 2024-04-04 DIAGNOSIS — E039 Hypothyroidism, unspecified: Secondary | ICD-10-CM

## 2024-04-04 LAB — T4, FREE: Free T4: 1.18 ng/dL (ref 0.60–1.60)

## 2024-04-04 LAB — TSH: TSH: 0.5 u[IU]/mL (ref 0.35–5.50)

## 2024-09-26 ENCOUNTER — Other Ambulatory Visit: Payer: Self-pay | Admitting: *Deleted

## 2024-09-26 ENCOUNTER — Encounter: Payer: Self-pay | Admitting: *Deleted

## 2024-09-26 DIAGNOSIS — E039 Hypothyroidism, unspecified: Secondary | ICD-10-CM

## 2024-09-26 MED ORDER — LEVOTHYROXINE SODIUM 200 MCG PO TABS: 200.0000 ug | ORAL_TABLET | Freq: Every day | ORAL | 3 refills | Status: AC

## 2024-09-26 NOTE — Telephone Encounter (Signed)
 [facility] sent over request for levothyroxine   Last filled 06/24/24

## 2024-12-12 NOTE — Progress Notes (Unsigned)
 SABRA

## 2024-12-16 ENCOUNTER — Encounter: Admitting: Family Medicine

## 2024-12-16 DIAGNOSIS — Z0000 Encounter for general adult medical examination without abnormal findings: Secondary | ICD-10-CM

## 2024-12-16 DIAGNOSIS — E538 Deficiency of other specified B group vitamins: Secondary | ICD-10-CM

## 2024-12-16 DIAGNOSIS — E039 Hypothyroidism, unspecified: Secondary | ICD-10-CM

## 2024-12-16 DIAGNOSIS — E611 Iron deficiency: Secondary | ICD-10-CM

## 2024-12-16 DIAGNOSIS — E559 Vitamin D deficiency, unspecified: Secondary | ICD-10-CM

## 2024-12-16 DIAGNOSIS — Z9884 Bariatric surgery status: Secondary | ICD-10-CM
